# Patient Record
Sex: Female | Born: 1950 | Hispanic: No | Smoking: Current every day smoker
Health system: Southern US, Community
[De-identification: ages and names within clinical notes are randomized; demographics above are authoritative.]

## PROBLEM LIST (undated history)

## (undated) DIAGNOSIS — I729 Aneurysm of unspecified site: Secondary | ICD-10-CM

## (undated) DIAGNOSIS — R4701 Aphasia: Secondary | ICD-10-CM

## (undated) DIAGNOSIS — I219 Acute myocardial infarction, unspecified: Secondary | ICD-10-CM

## (undated) DIAGNOSIS — I639 Cerebral infarction, unspecified: Secondary | ICD-10-CM

## (undated) HISTORY — PX: ABDOMINAL HYSTERECTOMY: SHX81

## (undated) HISTORY — PX: ANEURYSM COILING: SHX5349

---

## 2002-02-28 ENCOUNTER — Ambulatory Visit (HOSPITAL_BASED_OUTPATIENT_CLINIC_OR_DEPARTMENT_OTHER): Admission: RE | Admit: 2002-02-28 | Discharge: 2002-02-28 | Payer: Self-pay | Admitting: *Deleted

## 2002-04-08 ENCOUNTER — Ambulatory Visit (HOSPITAL_BASED_OUTPATIENT_CLINIC_OR_DEPARTMENT_OTHER): Admission: RE | Admit: 2002-04-08 | Discharge: 2002-04-08 | Payer: Self-pay | Admitting: *Deleted

## 2008-12-16 ENCOUNTER — Emergency Department (HOSPITAL_BASED_OUTPATIENT_CLINIC_OR_DEPARTMENT_OTHER): Admission: EM | Admit: 2008-12-16 | Discharge: 2008-12-16 | Payer: Self-pay | Admitting: Emergency Medicine

## 2010-07-12 LAB — COMPREHENSIVE METABOLIC PANEL
ALT: 12 U/L (ref 0–35)
AST: 27 U/L (ref 0–37)
Albumin: 4.3 g/dL (ref 3.5–5.2)
CO2: 30 mEq/L (ref 19–32)
Calcium: 9.6 mg/dL (ref 8.4–10.5)
GFR calc Af Amer: >60 mL/min (ref >60)
Sodium: 144 mEq/L (ref 135–145)
Total Protein: 7.6 g/dL (ref 6.0–8.3)

## 2010-07-12 LAB — CBC
MCHC: 35 g/dL (ref 30.0–36.0)
RBC: 4.72 MIL/uL (ref 3.87–5.11)
RDW: 13.4 % (ref 11.5–15.5)

## 2010-07-12 LAB — POCT I-STAT 3, ART BLOOD GAS (G3+)
Acid-Base Excess: 3 mmol/L — ABNORMAL HIGH (ref 0.0–2.0)
Bicarbonate: 27.3 meq/L — ABNORMAL HIGH (ref 20.0–24.0)
O2 Saturation: 95 %
Patient temperature: 37
TCO2: 29 mmol/L (ref 0–100)
pCO2 arterial: 41.8 mmHg (ref 35.0–45.0)
pH, Arterial: 7.423 — ABNORMAL HIGH (ref 7.350–7.400)
pO2, Arterial: 73 mmHg — ABNORMAL LOW (ref 80.0–100.0)

## 2010-07-12 LAB — DIFFERENTIAL
Basophils Absolute: 0.1 K/uL (ref 0.0–0.1)
Basophils Relative: 1 % (ref 0–1)
Eosinophils Absolute: 0.2 K/uL (ref 0.0–0.7)
Eosinophils Relative: 2 % (ref 0–5)
Lymphocytes Relative: 21 % (ref 12–46)
Lymphs Abs: 1.7 K/uL (ref 0.7–4.0)
Monocytes Absolute: 0.6 K/uL (ref 0.1–1.0)
Monocytes Relative: 8 % (ref 3–12)
Neutro Abs: 5.3 K/uL (ref 1.7–7.7)
Neutrophils Relative %: 68 % (ref 43–77)

## 2010-07-12 LAB — POCT TOXICOLOGY PANEL: Benzodiazepines: POSITIVE

## 2010-07-12 LAB — GLUCOSE, CAPILLARY: Glucose-Capillary: 85 mg/dL (ref 70–99)

## 2010-07-12 LAB — URINALYSIS, ROUTINE W REFLEX MICROSCOPIC
Glucose, UA: NEGATIVE mg/dL
Leukocytes, UA: NEGATIVE
Nitrite: NEGATIVE
Protein, ur: NEGATIVE mg/dL
pH: 7 (ref 5.0–8.0)

## 2010-07-12 LAB — URINE MICROSCOPIC-ADD ON

## 2010-07-12 LAB — SALICYLATE LEVEL: Salicylate Lvl: 1 mg/dL — ABNORMAL LOW (ref 2.8–20.0)

## 2010-07-12 LAB — ETHANOL

## 2010-07-12 LAB — ACETAMINOPHEN LEVEL

## 2010-08-23 NOTE — Op Note (Signed)
   NAME:  [name], Melinda Hinton                         ACCOUNT NO.:  [id number]   MEDICAL RECORD NO.:  [id number]                   PATIENT TYPE:  AMB   LOCATION:  DSC                                  FACILITY:  [facility]   PHYSICIAN:  Kathy Breach, M.D.                   DATE OF BIRTH:  1950/12/07   DATE OF PROCEDURE:  04/08/2002  DATE OF DISCHARGE:                                 OPERATIVE REPORT   PREOPERATIVE DIAGNOSIS:  Retracting tympanic membrane AS due to eustachian  tube dysfunction six weeks post fascial graft tympanoplasty.   OPERATION/PROCEDURE:  Myringotomy, insertion of tube AS.   POSTOPERATIVE DIAGNOSIS:  Retracting tympanic membrane AS due to eustachian  tube dysfunction six weeks post fascial graft tympanoplasty.   DESCRIPTION OF PROCEDURE:  With the patient under general inhalation  anesthesia, the left ear was inspected with the operating microscope.  The  patient had an intact thick tympanic membrane with retracting anterior  superiorly and an anterior quadrant.  This is a recent fascial graft  tympanoplasty of the large anterior perforation which had been a  tympanoplasty failure elsewhere previously.  Attempted myringotomy in the  office was unsuccessful, unable to get an incision completely through the  thickened tympanic membrane.  A radial myringotomy incision was made at the  junction of the anterior superior, the anterior inferior quadrants.  The  tympanic membrane was very thick with the fascia from the fascial grafts  still in place but with persistence and some difficulty, eventually an  incision was gotten completely through the tympanic membrane into the middle  ear space.  This was documented by readily passing air retrograde through  the eustachian tube.  Because of the thickness and flaccidity of the  tympanic membrane, I was unable to successfully insert a T tube but a #1  __________tube was inserted through-and-through with retrograde patency of  eustachian tube demonstrated with retrograde pneumatic pressure.  Floxin  Otic drops were displaced.  The patient tolerated the procedure well, was  taken to the recovery room in stable general condition.                                                 Kathy Breach, M.D.    [name]  D:  04/08/2002  T:  04/08/2002  Job:  [id number]

## 2010-08-23 NOTE — Op Note (Signed)
NAME:  Melinda Hinton, Melinda Hinton NO.:  [id number]   MEDICAL RECORD NO.:  [id number]                   PATIENT TYPE:  AMB   LOCATION:  DSC                                  FACILITY:  [facility]   PHYSICIAN:  Kathy Breach, M.D.                   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   OPERATION PERFORMED:  Bipedicle fascia graft, type 1 tympanoplasty, left  ear.   SURGEON:  Kathy Breach, M.D.   ANESTHESIA:  General orotracheal anesthesia.   DESCRIPTION OF PROCEDURE:  With the patient under general orotracheal  anesthesia, the left ear was prepped and draped in sterile fashion.  Inspection of the ear revealed  a 5 mm perforation involving centrally the  anterior inferior quadrant extending somewhat into the posterior inferior  and anterior superior quadrants.  The posterior margin of the perforation  was adherent down onto the promontory.  Through it the anterior rim could be  directly visualized.  Canal skin was infiltrated with 1% Xylocaine with  1:100,000 epinephrine for vasoconstriction.  Similar injection posterior  superior to the auricle above the hair line for donor site incision line.  The margins of the perforation were trimmed 360 degrees and freshened.  A  sickle knife was used to break down the adhesions of the posterior rim onto  the promontory.   About a 1-1/2 inch slightly curved incision posterior superior to the  auricle above the hairline was made immediately inferior to the previous  donor graft site incision.  The incision was carried down  through the skin  and into the subcutaneous tissue encountering a heavy layer of scar tissue.  This was divided, encountering anterior superior to the previous donor site  temporalis fascia.  A large temporalis fascia graft was harvested.  Hemostasis complete with touch electrocautery.  The incision was closed with  interrupted 4-0 chromic catgut subcutaneous sutures and the skin  approximated with a running 4-0 chromic catgut suture.   Attention returned to the ear.  A tympanotomy flap was incised from 6  o'clock inferiorly and 12 o'clock superiorly.  A flap was elevated entering  the middle ear space posterior superiorly identifying the lateral and  inferior surface of the chorda tympany in its normal anatomic position.  Immediately anterior to this, there were heavy adhesions posterior superior  quadrant which were cleaned away and broken down.  Complete adhesions along  the handle of the malleus to the long process of the incus and as well as  from the stapedius tendon to the promontory.  As these were cleared away,  manipulation of the long handle of the malleus revealed mobility of the  incus and an intact ossicular chain.  Further adhesions to the promontory  and the medial wall of the middle ear space to the undersurface of the  tympanic membrane anterosuperiorly were cleaned away.  The flap was turned  completely forward.  Gelfoam soaked in Floxin Otic solution was used to fill  the anterior half of the mesotympanum.  The previously harvested and pressed  and dried fascia graft was then inserted on the bed of Gelfoam beneath the  anterior tympanic membrane remnant and positioned spanning the perforation  site tucking it medial to the anterior rim perforation.  The middle ear  space was then filled completely with Gelfoam pledgets soaked with Floxin  Otic solution supporting the fascia graft up against the undersurface  tympanic membrane remnant and canal skin flap.  Careful  inspection showed  the fascia graft filled well into the perforation site completely spanning  the perforation site with the tympanotomy flap turned back in position.  It  was further stabilized then packing the external canal with Gelfoam soaked  with Floxin Otic suspension.  Sterile cotton was placed in the external  meatus.  The patient tolerated the procedure well and was taken to the  recovery room in stable general condition.                                                Kathy Breach, M.D.    [name]  D:  02/28/2002  T:  02/28/2002  Job:  [id number]

## 2012-04-24 ENCOUNTER — Encounter (HOSPITAL_BASED_OUTPATIENT_CLINIC_OR_DEPARTMENT_OTHER): Payer: Self-pay | Admitting: *Deleted

## 2012-04-24 ENCOUNTER — Emergency Department (HOSPITAL_BASED_OUTPATIENT_CLINIC_OR_DEPARTMENT_OTHER)
Admission: EM | Admit: 2012-04-24 | Discharge: 2012-04-25 | Disposition: A | Discharge status: Home / Self Care | Payer: Medicare Other | Attending: Emergency Medicine | Admitting: Emergency Medicine

## 2012-04-24 DIAGNOSIS — F17200 Nicotine dependence, unspecified, uncomplicated: Secondary | ICD-10-CM | POA: Insufficient documentation

## 2012-04-24 DIAGNOSIS — Z8679 Personal history of other diseases of the circulatory system: Secondary | ICD-10-CM | POA: Insufficient documentation

## 2012-04-24 DIAGNOSIS — H9209 Otalgia, unspecified ear: Secondary | ICD-10-CM | POA: Insufficient documentation

## 2012-04-24 DIAGNOSIS — H9202 Otalgia, left ear: Secondary | ICD-10-CM

## 2012-04-24 DIAGNOSIS — Z8673 Personal history of transient ischemic attack (TIA), and cerebral infarction without residual deficits: Secondary | ICD-10-CM | POA: Insufficient documentation

## 2012-04-24 HISTORY — DX: Cerebral infarction, unspecified: I63.9

## 2012-04-24 HISTORY — DX: Aphasia: R47.01

## 2012-04-24 HISTORY — DX: Aneurysm of unspecified site: I72.9

## 2012-04-24 MED ORDER — IBUPROFEN 400 MG PO TABS
600.0000 mg | ORAL_TABLET | Freq: Once | ORAL | Status: AC
  Administered 2012-04-24: 600 mg via ORAL
  Filled 2012-04-24: qty 1

## 2012-04-24 MED ORDER — ANTIPYRINE-BENZOCAINE 5.4-1.4 % OT SOLN
3.0000 [drp] | Freq: Once | OTIC | Status: AC
  Administered 2012-04-24: 3 [drp] via OTIC
  Filled 2012-04-24: qty 10

## 2012-04-24 MED ORDER — CIPROFLOXACIN-HYDROCORTISONE 0.2-1 % OT SUSP: 3.0000 [drp] | Freq: Two times a day (BID) | OTIC | Status: DC

## 2012-04-24 NOTE — ED Provider Notes (Signed)
History   Scribed for No att. providers found, the patient was seen in room MHOTF/OTF . This chart was scribed by Lewanda Rife.   CSN: [id number]  Arrival date & time 04/24/12  2242   First MD Initiated Contact with Patient 04/24/12 2321      Chief Complaint  Patient presents with  . Otalgia    (Consider location/radiation/quality/duration/timing/severity/associated sxs/prior Treatment) Hx provided by husband. Level 5 caveat applies due to expressive aphasia.  Patient is a 62 y.o. female presenting with ear pain. The history is provided by the spouse and the patient. History Limited By: expressive aphasia. No language interpreter was used.  Otalgia This is a new problem. The current episode started more than 2 days ago. There is pain in the left ear. The problem occurs constantly. The problem has been gradually worsening. There has been no fever. The pain is severe. Associated symptoms include ear discharge. Pertinent negatives include no headaches, no hearing loss, no diarrhea and no vomiting.   Melinda Hinton is a 62 y.o. female who presents to the Emergency Department complaining of worsening constant left earache onset this evening. Husband reports pt had ears cleaned 4 days ago at the ENT office. Husband denies fever. Pt describes the pain as severe. Husband reports pt had some sanguineous drainage following procedure, but reports drainage is clear today. Husband reports giving pt 2 Tylenol PM's at home to treat pain with mild relief.   Past Medical History  Diagnosis Date  . Stroke   . Aneurysm   . Expressive aphasia     Past Surgical History  Procedure Date  . Aneurysm coiling     No family history on file.  History  Substance Use Topics  . Smoking status: Current Every Day Smoker  . Smokeless tobacco: Not on file  . Alcohol Use: No    OB History    Grav Para Term Preterm Abortions TAB SAB Ect Mult Living                  Review of Systems  Unable to  perform ROS: Other  Constitutional: Negative.   HENT: Positive for ear pain (left ear) and ear discharge. Negative for hearing loss.   Respiratory: Negative.   Cardiovascular: Negative.   Gastrointestinal: Negative.  Negative for vomiting and diarrhea.  Musculoskeletal: Negative.   Skin: Negative.   Neurological: Negative.  Negative for headaches.  Hematological: Negative.   Psychiatric/Behavioral: Negative.   level 5 caveat   Allergies  Review of patient's allergies indicates no known allergies.  Home Medications   Current Outpatient Rx  Name  Route  Sig  Dispense  Refill  . CIPROFLOXACIN-HYDROCORTISONE 0.2-1 % OT SUSP   Left Ear   Place 3 drops into the left ear 2 (two) times daily.   10 mL   0     BP 220/80  Pulse 80  Temp 97.5 F (36.4 C)  Resp 18  Ht 5\' 1"  (1.549 m)  Wt 130 lb (58.968 kg)  BMI 24.56 kg/m2  SpO2 98%  LMP 04/24/2012  Physical Exam  Nursing note and vitals reviewed. Constitutional: She appears well-developed and well-nourished.  HENT:  Head: Normocephalic and atraumatic.  Right Ear: Tympanic membrane normal.       Left TM is thickened No TM rupture, but external EAC is erythematous  No pain with movement of the pinna    Eyes: Conjunctivae normal are normal.  Neck: Normal range of motion. Neck supple.  Cardiovascular: Normal rate.  Pulmonary/Chest: Effort normal.  Lymphadenopathy:    She has no cervical adenopathy.  Neurological: She is alert.  Skin: Skin is warm and dry.  Psychiatric: She has a normal mood and affect.  note- no ttp over mastoid process  ED Course  Procedures (including critical care time)  Labs Reviewed - No data to display No results found.   1. Left ear pain       MDM  Pt presenting with left ear pain-had ear irrigated and vacuumed for cerumen impaction 3 days ago.  On exam EAC is erythematous, no OM- may be early otitis externa.  Pt given auralgan in ED and rx for cipro otic drops.  If pain persists, they  will contact ENT on Monday.  Discharged with strict return precautions.  Pt agreeable with plan.    I personally performed the services described in this documentation, which was scribed in my presence. The recorded information has been reviewed and is accurate.    Ethelda Chick, MD 04/25/12 [phone]

## 2012-04-24 NOTE — ED Notes (Signed)
Pt's husband states pt has a hx of CVA with expressive aphasia.  Husband states pt had her ears cleaned at the ENT on Wednesday. Pt. Was crying this evening c/o left ear hurting. No fevers per husband. Denies any cold/congestion. No  Other complaints.

## 2012-04-25 ENCOUNTER — Encounter (HOSPITAL_BASED_OUTPATIENT_CLINIC_OR_DEPARTMENT_OTHER): Payer: Self-pay | Admitting: *Deleted

## 2012-04-25 ENCOUNTER — Emergency Department (HOSPITAL_BASED_OUTPATIENT_CLINIC_OR_DEPARTMENT_OTHER)
Admission: EM | Admit: 2012-04-25 | Discharge: 2012-04-25 | Disposition: A | Discharge status: Home / Self Care | Payer: Medicare Other | Attending: Emergency Medicine | Admitting: Emergency Medicine

## 2012-04-25 DIAGNOSIS — F17200 Nicotine dependence, unspecified, uncomplicated: Secondary | ICD-10-CM | POA: Insufficient documentation

## 2012-04-25 DIAGNOSIS — H1045 Other chronic allergic conjunctivitis: Secondary | ICD-10-CM | POA: Insufficient documentation

## 2012-04-25 DIAGNOSIS — H6690 Otitis media, unspecified, unspecified ear: Secondary | ICD-10-CM

## 2012-04-25 DIAGNOSIS — H5789 Other specified disorders of eye and adnexa: Secondary | ICD-10-CM | POA: Insufficient documentation

## 2012-04-25 DIAGNOSIS — H9210 Otorrhea, unspecified ear: Secondary | ICD-10-CM | POA: Insufficient documentation

## 2012-04-25 DIAGNOSIS — Z8673 Personal history of transient ischemic attack (TIA), and cerebral infarction without residual deficits: Secondary | ICD-10-CM | POA: Insufficient documentation

## 2012-04-25 DIAGNOSIS — H1010 Acute atopic conjunctivitis, unspecified eye: Secondary | ICD-10-CM

## 2012-04-25 DIAGNOSIS — H9190 Unspecified hearing loss, unspecified ear: Secondary | ICD-10-CM | POA: Insufficient documentation

## 2012-04-25 DIAGNOSIS — Z8659 Personal history of other mental and behavioral disorders: Secondary | ICD-10-CM | POA: Insufficient documentation

## 2012-04-25 DIAGNOSIS — Z8679 Personal history of other diseases of the circulatory system: Secondary | ICD-10-CM | POA: Insufficient documentation

## 2012-04-25 MED ORDER — DIPHENHYDRAMINE HCL 25 MG PO TABS: 25.0000 mg | ORAL_TABLET | Freq: Three times a day (TID) | ORAL | Status: DC | PRN

## 2012-04-25 MED ORDER — AMOXICILLIN-POT CLAVULANATE 875-125 MG PO TABS
1.0000 | ORAL_TABLET | Freq: Once | ORAL | Status: AC
  Administered 2012-04-25: 1 via ORAL
  Filled 2012-04-25: qty 1

## 2012-04-25 MED ORDER — CETIRIZINE HCL 10 MG PO CAPS: 10.0000 mg | ORAL_CAPSULE | Freq: Every day | ORAL | Status: DC

## 2012-04-25 MED ORDER — AMOXICILLIN-POT CLAVULANATE 875-125 MG PO TABS: 1.0000 | ORAL_TABLET | Freq: Two times a day (BID) | ORAL | Status: DC

## 2012-04-25 NOTE — ED Provider Notes (Signed)
History     CSN: [id number]  Arrival date & time 04/25/12  1309   First MD Initiated Contact with Patient 04/25/12 1358      Chief Complaint  Patient presents with  . Facial Swelling    (Consider location/radiation/quality/duration/timing/severity/associated sxs/prior treatment) HPI Comments: Patient who was seen in the ED yesterday dx with acute otitis externa presents with left ear and eye swelling.  Patient was given Antipyrine drops which she used last night.  She was prescribed Cipro drops but has not had the prescription filled yet.  She woke up this morning with facial swelling and redness around her left eye with associated discharge.  She denies eye pain or changes in vision.  Her husband states that her left ear also looks more swollen and red.  He reports a clear discharge from her ear.  This discharge was purulent several days ago. Decreased hearing L ear. Patient denies fever, headache, nausea and diarrhea.  Initial onset gradual with acute worsening over past 12 hours, nothing has helped with symptoms.   The history is provided by the patient and the spouse.    Past Medical History  Diagnosis Date  . Stroke   . Aneurysm   . Expressive aphasia     Past Surgical History  Procedure Date  . Aneurysm coiling     History reviewed. No pertinent family history.  History  Substance Use Topics  . Smoking status: Current Every Day Smoker  . Smokeless tobacco: Not on file  . Alcohol Use: No    OB History    Grav Para Term Preterm Abortions TAB SAB Ect Mult Living                  Review of Systems  Constitutional: Negative for fever, chills, appetite change and fatigue.  HENT: Positive for hearing loss, ear pain, facial swelling and ear discharge. Negative for congestion, sore throat, rhinorrhea, trouble swallowing, neck pain, neck stiffness, sinus pressure and tinnitus.   Eyes: Positive for pain, discharge and redness. Negative for itching and visual disturbance.    Respiratory: Negative for cough and shortness of breath.   Cardiovascular: Negative for chest pain.  Gastrointestinal: Negative for nausea, vomiting, abdominal pain and diarrhea.  Genitourinary: Negative for dysuria.  Musculoskeletal: Negative for myalgias.  Skin: Negative for rash.  Neurological: Negative for headaches.    Allergies  Review of patient's allergies indicates no known allergies.  Home Medications   Current Outpatient Rx  Name  Route  Sig  Dispense  Refill  . CIPROFLOXACIN-HYDROCORTISONE 0.2-1 % OT SUSP   Left Ear   Place 3 drops into the left ear 2 (two) times daily.   10 mL   0     BP 188/82  Pulse 78  Temp 97.4 F (36.3 C) (Oral)  Resp 24  Ht 5\' 1"  (1.549 m)  Wt 130 lb (58.968 kg)  BMI 24.56 kg/m2  SpO2 95%  LMP 04/24/2012  Physical Exam  Nursing note and vitals reviewed. Constitutional: She appears well-developed and well-nourished.  HENT:  Head: Normocephalic and atraumatic.  Right Ear: Tympanic membrane and external ear normal. No mastoid tenderness. Tympanic membrane is not perforated and not bulging. No decreased hearing is noted.  Left Ear: There is drainage, swelling (of external ear, canal) and tenderness (external ear, canal). No foreign bodies. No mastoid tenderness. Tympanic membrane is perforated (possible). Tympanic membrane is not erythematous and not bulging. Decreased hearing is noted.  Nose: Nose normal.  Mouth/Throat: Oropharynx is clear  and moist.  Eyes: EOM are normal. Pupils are equal, round, and reactive to light. Right eye exhibits no chemosis and no discharge. Left eye exhibits chemosis. Left eye exhibits no discharge, no exudate and no hordeolum. No foreign body present in the left eye. Right conjunctiva is injected. Right conjunctiva has no hemorrhage. Left conjunctiva is injected. Left conjunctiva has no hemorrhage. No scleral icterus.         Mild erythema surrounding left eye without warmth.  Neck: Normal range of motion.  Neck supple.  Cardiovascular: Normal rate, regular rhythm and normal heart sounds.   Pulmonary/Chest: Effort normal and breath sounds normal.  Abdominal: Soft. There is no tenderness.  Lymphadenopathy:    She has no cervical adenopathy.  Neurological: She is alert.  Skin: Skin is warm and dry.  Psychiatric: She has a normal mood and affect.    ED Course  Procedures (including critical care time)  Labs Reviewed - No data to display No results found.   1. Otitis media   2. Allergic conjunctivitis     Patient seen and examined. Yesterday's chart reviewed.   Vital signs reviewed and are as follows: Filed Vitals:   04/25/12 1337  BP: 188/82  Pulse: 78  Temp: 97.4 F (36.3 C)  Resp: 24   Patient d/w and seen by Dr. Ranae Palms.   Suspect TM perforation. Will treat for allergic type conjunctivitis, start on augmentin for otitis media, d/c cipro drops and A/B otic given concern for TM rupture.   Patient has ENT followup and will followup early this week.   MDM  Ear pain swelling: Concern for ruptured TM on left. There is irritation of the external ear and ear canal without mastoid tenderness or signs of malignant otitis externa. Patient has close ENT followup. She appears well, nontoxic. Afebrile.  Conjunctivitis with chemosis: Redness around the eye and chemosis is more consistent with allergic type conjunctivitis than cellulitis. Do not suspect orbital or periorbital cellulitis. Patient has good range of motion of eyes without pain. Again, no fever. No history of diabetes. Will treat with antihistamines.       Renne Crigler, [place] 04/25/12 [phone]

## 2012-04-25 NOTE — ED Notes (Signed)
Here last night for same. Given Antipyrine drops to take home. Did not get antibiotic drops filled. Now presents with increased redness and swelling.

## 2012-04-26 NOTE — ED Provider Notes (Signed)
Medical screening examination/treatment/procedure(s) were conducted as a shared visit with non-physician practitioner(s) and myself.  I personally evaluated the patient during the encounter   Loren Racer, MD 04/26/12 [phone]

## 2013-01-11 ENCOUNTER — Emergency Department (HOSPITAL_BASED_OUTPATIENT_CLINIC_OR_DEPARTMENT_OTHER)
Admission: EM | Admit: 2013-01-11 | Discharge: 2013-01-11 | Disposition: A | Discharge status: Other Acute Inpatient Hospital | Payer: Medicare Other | Attending: Emergency Medicine | Admitting: Emergency Medicine

## 2013-01-11 ENCOUNTER — Encounter (HOSPITAL_BASED_OUTPATIENT_CLINIC_OR_DEPARTMENT_OTHER): Payer: Self-pay | Admitting: *Deleted

## 2013-01-11 ENCOUNTER — Emergency Department (HOSPITAL_BASED_OUTPATIENT_CLINIC_OR_DEPARTMENT_OTHER): Payer: Medicare Other

## 2013-01-11 DIAGNOSIS — Z792 Long term (current) use of antibiotics: Secondary | ICD-10-CM | POA: Insufficient documentation

## 2013-01-11 DIAGNOSIS — F17200 Nicotine dependence, unspecified, uncomplicated: Secondary | ICD-10-CM | POA: Insufficient documentation

## 2013-01-11 DIAGNOSIS — Y939 Activity, unspecified: Secondary | ICD-10-CM | POA: Insufficient documentation

## 2013-01-11 DIAGNOSIS — S0100XA Unspecified open wound of scalp, initial encounter: Secondary | ICD-10-CM | POA: Insufficient documentation

## 2013-01-11 DIAGNOSIS — R42 Dizziness and giddiness: Secondary | ICD-10-CM | POA: Insufficient documentation

## 2013-01-11 DIAGNOSIS — W19XXXA Unspecified fall, initial encounter: Secondary | ICD-10-CM | POA: Insufficient documentation

## 2013-01-11 DIAGNOSIS — I4581 Long QT syndrome: Secondary | ICD-10-CM | POA: Insufficient documentation

## 2013-01-11 DIAGNOSIS — R4701 Aphasia: Secondary | ICD-10-CM | POA: Insufficient documentation

## 2013-01-11 DIAGNOSIS — Y92009 Unspecified place in unspecified non-institutional (private) residence as the place of occurrence of the external cause: Secondary | ICD-10-CM | POA: Insufficient documentation

## 2013-01-11 DIAGNOSIS — S065X9A Traumatic subdural hemorrhage with loss of consciousness of unspecified duration, initial encounter: Secondary | ICD-10-CM

## 2013-01-11 DIAGNOSIS — R9431 Abnormal electrocardiogram [ECG] [EKG]: Secondary | ICD-10-CM

## 2013-01-11 DIAGNOSIS — Z79899 Other long term (current) drug therapy: Secondary | ICD-10-CM | POA: Insufficient documentation

## 2013-01-11 DIAGNOSIS — Z8673 Personal history of transient ischemic attack (TIA), and cerebral infarction without residual deficits: Secondary | ICD-10-CM | POA: Insufficient documentation

## 2013-01-11 DIAGNOSIS — R488 Other symbolic dysfunctions: Secondary | ICD-10-CM | POA: Insufficient documentation

## 2013-01-11 DIAGNOSIS — Z8679 Personal history of other diseases of the circulatory system: Secondary | ICD-10-CM | POA: Insufficient documentation

## 2013-01-11 DIAGNOSIS — IMO0002 Reserved for concepts with insufficient information to code with codable children: Secondary | ICD-10-CM | POA: Insufficient documentation

## 2013-01-11 DIAGNOSIS — Z23 Encounter for immunization: Secondary | ICD-10-CM | POA: Insufficient documentation

## 2013-01-11 LAB — COMPREHENSIVE METABOLIC PANEL
ALT: 27 U/L (ref 0–35)
Albumin: 4.6 g/dL (ref 3.5–5.2)
Alkaline Phosphatase: 115 U/L (ref 39–117)
Calcium: 10.7 mg/dL — ABNORMAL HIGH (ref 8.4–10.5)
GFR calc Af Amer: 88 mL/min — ABNORMAL LOW (ref >90)
Potassium: 3.2 mEq/L — ABNORMAL LOW (ref 3.5–5.1)
Sodium: 142 mEq/L (ref 135–145)
Total Protein: 7.8 g/dL (ref 6.0–8.3)

## 2013-01-11 LAB — CBC WITH DIFFERENTIAL/PLATELET
Basophils Absolute: 0 10*3/uL (ref 0.0–0.1)
Basophils Relative: 0 % (ref 0–1)
Eosinophils Absolute: 0 10*3/uL (ref 0.0–0.7)
Eosinophils Relative: 0 % (ref 0–5)
MCH: 31.4 pg (ref 26.0–34.0)
MCHC: 34.8 g/dL (ref 30.0–36.0)
MCV: 90.4 fL (ref 78.0–100.0)
Neutrophils Relative %: 88 % — ABNORMAL HIGH (ref 43–77)
Platelets: 339 10*3/uL (ref 150–400)
RBC: 4.77 MIL/uL (ref 3.87–5.11)
RDW: 13.2 % (ref 11.5–15.5)

## 2013-01-11 LAB — URINE MICROSCOPIC-ADD ON

## 2013-01-11 LAB — URINALYSIS, ROUTINE W REFLEX MICROSCOPIC
Bilirubin Urine: NEGATIVE
Hgb urine dipstick: NEGATIVE
Ketones, ur: 15 mg/dL — AB
Nitrite: NEGATIVE
Protein, ur: 100 mg/dL — AB
Specific Gravity, Urine: 1.021 (ref 1.005–1.030)
Urobilinogen, UA: 0.2 mg/dL (ref 0.0–1.0)

## 2013-01-11 LAB — PROTIME-INR
INR: 0.95 (ref 0.00–1.49)
Prothrombin Time: 12.5 seconds (ref 11.6–15.2)

## 2013-01-11 LAB — CK TOTAL AND CKMB (NOT AT ARMC)
Relative Index: INVALID (ref 0.0–2.5)
Total CK: 63 U/L (ref 7–177)

## 2013-01-11 LAB — TROPONIN I: Troponin I: <0.3 ng/mL (ref <0.30)

## 2013-01-11 MED ORDER — TETANUS-DIPHTH-ACELL PERTUSSIS 5-2.5-18.5 LF-MCG/0.5 IM SUSP
0.5000 mL | Freq: Once | INTRAMUSCULAR | Status: AC
  Administered 2013-01-11: 0.5 mL via INTRAMUSCULAR
  Filled 2013-01-11: qty 0.5

## 2013-01-11 MED ORDER — ACETAMINOPHEN 500 MG PO TABS
1000.0000 mg | ORAL_TABLET | Freq: Once | ORAL | Status: AC
  Administered 2013-01-11: 1000 mg via ORAL
  Filled 2013-01-11: qty 2

## 2013-01-11 NOTE — ED Notes (Signed)
Husband came home found wife on concrete floor w lac to back of head,  Does not know what caused her to fall,  Unsure of loc

## 2013-01-11 NOTE — ED Notes (Signed)
MD at bedside., pt awaiting transport to [facility], husband remains at bedside

## 2013-01-11 NOTE — ED Provider Notes (Signed)
CSN: [id number]     Arrival date & time 01/11/13  1739 History   First MD Initiated Contact with Patient 01/11/13 1754     Chief Complaint  Patient presents with  . Fall   (Consider location/radiation/quality/duration/timing/severity/associated sxs/prior Treatment) The history is provided by the spouse, a relative and medical records. No language interpreter was used.    62 year old female presents the emergency department brought in by family.  The patient has a past medical history of stroke and aneurysm.  She has both expressive aphasia and apraxia.  States that he went out to do some pressure washing for approximately 5 hours.  When he returned he found her lying in a pool of blood on the concrete.  He is unknown how long she was on the floor.  The patient is able to understand speech and cannot yes and no to questions.  She does acknowledge some dizziness beforehand and it did not appear that she had a mechanical fall today.  She also acknowledges having a slight headache at this time.  The patient has a small laceration to the back of the scalp.  She is not taking any blood thinners   Past Medical History  Diagnosis Date  . Stroke   . Aneurysm   . Expressive aphasia    Past Surgical History  Procedure Laterality Date  . Aneurysm coiling     History reviewed. No pertinent family history. History  Substance Use Topics  . Smoking status: Current Every Day Smoker  . Smokeless tobacco: Not on file  . Alcohol Use: No   OB History   Grav Para Term Preterm Abortions TAB SAB Ect Mult Living                 Review of Systems  Unable to perform ROS    Allergies  Review of patient's allergies indicates no known allergies.  Home Medications   Current Outpatient Rx  Name  Route  Sig  Dispense  Refill  . amoxicillin-clavulanate (AUGMENTIN) 875-125 MG per tablet   Oral   Take 1 tablet by mouth every 12 (twelve) hours.   14 tablet   0   . Cetirizine HCl 10 MG CAPS   Oral    Take 1 capsule (10 mg total) by mouth daily.   7 capsule   0   . ciprofloxacin-hydrocortisone (CIPRO HC) otic suspension   Left Ear   Place 3 drops into the left ear 2 (two) times daily.   10 mL   0   . diphenhydrAMINE (BENADRYL) 25 MG tablet   Oral   Take 1 tablet (25 mg total) by mouth every 8 (eight) hours as needed for itching or allergies.   10 tablet   0    BP 171/90  Pulse 99  Resp 16  Ht 5\' 2"  (1.575 m)  Wt 125 lb (56.7 kg)  BMI 22.86 kg/m2  SpO2 99%  LMP 04/24/2012 Physical Exam  Constitutional: She is oriented to person, place, and time. She appears well-developed and well-nourished. No distress.  HENT:  Head: Normocephalic.  1 cm laceration post scalp  Eyes: Conjunctivae and EOM are normal. Pupils are equal, round, and reactive to light. No scleral icterus.  Neck: Normal range of motion. Neck supple.  Cardiovascular: Normal rate, regular rhythm and normal heart sounds.  Exam reveals no gallop and no friction rub.   No murmur heard. Pulmonary/Chest: Effort normal and breath sounds normal. No respiratory distress.  Abdominal: Soft. Bowel sounds are normal.  She exhibits no distension and no mass. There is no tenderness. There is no guarding.  Neurological: She is alert and oriented to person, place, and time.  Skin: Skin is warm and dry. She is not diaphoretic.    ED Course  Procedures (including critical care time) Labs Review Labs Reviewed  CBC WITH DIFFERENTIAL - Abnormal; Notable for the following:    WBC 16.7 (*)    Neutrophils Relative % 88 (*)    Neutro Abs 14.7 (*)    Lymphocytes Relative 7 (*)    All other components within normal limits  COMPREHENSIVE METABOLIC PANEL - Abnormal; Notable for the following:    Potassium 3.2 (*)    Glucose, Bld 126 (*)    Calcium 10.7 (*)    GFR calc non Af Amer 76 (*)    GFR calc Af Amer 88 (*)    All other components within normal limits  CK TOTAL AND CKMB  PROTIME-INR  APTT  URINALYSIS, ROUTINE W REFLEX  MICROSCOPIC   Imaging Review Dg Chest 2 View  01/11/2013   CLINICAL DATA:  Short of breath  EXAM: CHEST  2 VIEW  COMPARISON:  Concurrently obtained head CT  FINDINGS: Low lung volumes with linear atelectasis versus scarring in the lingula. Mild central bronchitic changes. Negative for pulmonary edema, focal airspace consolidation, pleural effusion or pneumothorax. Cardiac and mediastinal contours are within normal limits. Atherosclerotic calcifications noted in the transverse aorta. No acute osseous abnormality. The bones appear mildly osteopenic.  IMPRESSION: 1. No active cardiopulmonary disease. 2. Linear atelectasis versus scarring in the lingula. 3. Central bronchitic changes. 4. Aortic atherosclerosis.   Electronically Signed   By: Malachy Moan M.D.   On: 01/11/2013 19:28   Ct Head Wo Contrast  01/11/2013   CLINICAL DATA:  Shoulder and  EXAM: CT HEAD WITHOUT CONTRAST  TECHNIQUE: Contiguous axial images were obtained from the base of the skull through the vertex without intravenous contrast.  COMPARISON:  None.  FINDINGS: High attenuation material layering over the left frontal convexity consistent with acute subdural hematoma. The maximum width of the hematoma measures 7 mm. Minimal local mass effect. No midline shift. Sequelae of prior left MCA and PCA territory infarcts. Coil pack is noted in the region of the supra clinoid left internal carotid artery versus proximal MCA. Streak artifact slightly limits evaluation of the middle and posterior cranial fossa. Surgical changes of prior left frontal and temporal craniotomy. No definite acute infarction, mass or mass effect. No acute calvarial abnormality. Mild soft tissue swelling overlying the occipital scalp without calvarial hematoma. Globes and orbits are intact and unremarkable. Normal aeration of the mastoid air cells and visualized paranasal sinuses.  : IMPRESSION:  Acute left frontal subdural hematoma.  Sequelae of prior a left supra clinoid  ICA versus proximal MCA coil embolization, left frontal temporal craniotomy and large remote left MCA and PCA territory infarcts.  Critical Value/emergent results were called by telephone at the time of interpretation on 01/11/2013 at 7:42 PM to Dr.Sheldon, who verbally acknowledged these results.   Electronically Signed   By: Malachy Moan M.D.   On: 01/11/2013 19:43      Date: 01/11/2013  Rate: 88  Rhythm: normal sinus rhythm  QRS Axis: normal  Intervals: QT prolonged and (598 ms)  ST/T Wave abnormalities: normal  Conduction Disutrbances:none  Narrative Interpretation:   Old EKG Reviewed: changes noted QT is prolonged   MDM   1. Fall, initial encounter   2. Subdural hematoma  3. QT prolongation    Patient with fall. Unknown if mecjhanical or unwitnessed syncope. Patient with Prolonged QT  8:23 PM CT shows acute left frontal subdural Hematoma. She has leukocytosis. Urine is pending. The patient has a long history at [facility]. I have placved a consult to Neurosurgery. The patient appears reasonably stabilized for admission considering the current resources, flow, and capabilities available in the ED at this time, and I doubt any other [facility] requiring further screening and/or treatment in the ED prior to admission.   DR. Freund ofd neursurgery asks for medical admission but will consult on the patient. I have spoken with Dr. Selena Batten who will admit.      Arthor Captain, PA-C 01/12/13 [phone]

## 2013-01-11 NOTE — ED Notes (Signed)
Husband reports finding pt on concrete patio after fall , down unknown amt of time, laceration to posterior head ? LOC

## 2013-01-12 NOTE — ED Provider Notes (Signed)
Medical screening examination/treatment/procedure(s) were conducted as a shared visit with non-physician practitioner(s) and myself.  I personally evaluated the patient during the encounter  Pt with history of stroke and aneurysmal bleed. Unwitnessed fall, has small SDH on CT. Admit for obs.   Chelbi Herber B. Bernette Mayers, MD 01/12/13 1404

## 2015-10-21 ENCOUNTER — Emergency Department (HOSPITAL_BASED_OUTPATIENT_CLINIC_OR_DEPARTMENT_OTHER): Payer: Medicare HMO

## 2015-10-21 ENCOUNTER — Emergency Department (HOSPITAL_BASED_OUTPATIENT_CLINIC_OR_DEPARTMENT_OTHER)
Admission: EM | Admit: 2015-10-21 | Discharge: 2015-10-22 | Disposition: A | Discharge status: Home / Self Care | Payer: Medicare HMO | Attending: Emergency Medicine | Admitting: Emergency Medicine

## 2015-10-21 ENCOUNTER — Encounter (HOSPITAL_BASED_OUTPATIENT_CLINIC_OR_DEPARTMENT_OTHER): Payer: Self-pay | Admitting: *Deleted

## 2015-10-21 DIAGNOSIS — R300 Dysuria: Secondary | ICD-10-CM | POA: Insufficient documentation

## 2015-10-21 DIAGNOSIS — R4182 Altered mental status, unspecified: Secondary | ICD-10-CM | POA: Insufficient documentation

## 2015-10-21 DIAGNOSIS — R509 Fever, unspecified: Secondary | ICD-10-CM | POA: Diagnosis not present

## 2015-10-21 DIAGNOSIS — Z7982 Long term (current) use of aspirin: Secondary | ICD-10-CM | POA: Diagnosis not present

## 2015-10-21 DIAGNOSIS — Z79899 Other long term (current) drug therapy: Secondary | ICD-10-CM | POA: Diagnosis not present

## 2015-10-21 DIAGNOSIS — Z8673 Personal history of transient ischemic attack (TIA), and cerebral infarction without residual deficits: Secondary | ICD-10-CM | POA: Insufficient documentation

## 2015-10-21 DIAGNOSIS — F17200 Nicotine dependence, unspecified, uncomplicated: Secondary | ICD-10-CM | POA: Insufficient documentation

## 2015-10-21 HISTORY — DX: Acute myocardial infarction, unspecified: I21.9

## 2015-10-21 LAB — CBC WITH DIFFERENTIAL/PLATELET
Basophils Absolute: 0 10*3/uL (ref 0.0–0.1)
Basophils Relative: 0 %
Eosinophils Absolute: 0 10*3/uL (ref 0.0–0.7)
Eosinophils Relative: 1 %
HCT: 41.9 % (ref 36.0–46.0)
Hemoglobin: 14.1 g/dL (ref 12.0–15.0)
Lymphocytes Relative: 5 %
Lymphs Abs: 0.4 10*3/uL — ABNORMAL LOW (ref 0.7–4.0)
MCH: 31.3 pg (ref 26.0–34.0)
MCHC: 33.7 g/dL (ref 30.0–36.0)
MCV: 93.1 fL (ref 78.0–100.0)
Monocytes Absolute: 0.7 10*3/uL (ref 0.1–1.0)
Monocytes Relative: 8 %
Neutro Abs: 7.5 10*3/uL (ref 1.7–7.7)
Neutrophils Relative %: 86 %
Platelets: 284 10*3/uL (ref 150–400)
RBC: 4.5 MIL/uL (ref 3.87–5.11)
RDW: 13.6 % (ref 11.5–15.5)
WBC: 8.6 10*3/uL (ref 4.0–10.5)

## 2015-10-21 LAB — COMPREHENSIVE METABOLIC PANEL
ALT: 18 U/L (ref 14–54)
AST: 21 U/L (ref 15–41)
Albumin: 4 g/dL (ref 3.5–5.0)
Alkaline Phosphatase: 81 U/L (ref 38–126)
Anion gap: 8 (ref 5–15)
BUN: 22 mg/dL — ABNORMAL HIGH (ref 6–20)
CO2: 23 mmol/L (ref 22–32)
Calcium: 9.1 mg/dL (ref 8.9–10.3)
Chloride: 107 mmol/L (ref 101–111)
Creatinine, Ser: 1 mg/dL (ref 0.44–1.00)
GFR calc Af Amer: >60 mL/min (ref >60)
GFR calc non Af Amer: 57 mL/min — ABNORMAL LOW (ref >60)
Glucose, Bld: 136 mg/dL — ABNORMAL HIGH (ref 65–99)
Potassium: 3.7 mmol/L (ref 3.5–5.1)
Sodium: 138 mmol/L (ref 135–145)
Total Bilirubin: 0.3 mg/dL (ref 0.3–1.2)
Total Protein: 7.3 g/dL (ref 6.5–8.1)

## 2015-10-21 LAB — URINE MICROSCOPIC-ADD ON: WBC, UA: NONE SEEN WBC/hpf (ref 0–5)

## 2015-10-21 LAB — URINALYSIS, ROUTINE W REFLEX MICROSCOPIC
Bilirubin Urine: NEGATIVE
Glucose, UA: NEGATIVE mg/dL
Ketones, ur: NEGATIVE mg/dL
Leukocytes, UA: NEGATIVE
Nitrite: NEGATIVE
Protein, ur: NEGATIVE mg/dL
Specific Gravity, Urine: 1.025 (ref 1.005–1.030)
pH: 6 (ref 5.0–8.0)

## 2015-10-21 MED ORDER — SODIUM CHLORIDE 0.9 % IV BOLUS (SEPSIS)
1000.0000 mL | Freq: Once | INTRAVENOUS | Status: AC
  Administered 2015-10-21: 1000 mL via INTRAVENOUS

## 2015-10-21 MED ORDER — ACETAMINOPHEN 325 MG PO TABS
650.0000 mg | ORAL_TABLET | Freq: Once | ORAL | Status: AC
  Administered 2015-10-21: 650 mg via ORAL
  Filled 2015-10-21: qty 2

## 2015-10-21 MED ORDER — DEXTROSE 5 % IV SOLN
1.0000 g | Freq: Once | INTRAVENOUS | Status: DC
  Filled 2015-10-21: qty 10

## 2015-10-21 MED ORDER — CEPHALEXIN 250 MG PO CAPS
500.0000 mg | ORAL_CAPSULE | Freq: Once | ORAL | Status: AC
  Administered 2015-10-22: 500 mg via ORAL
  Filled 2015-10-21: qty 2

## 2015-10-21 MED ORDER — CEPHALEXIN 500 MG PO CAPS: 500.0000 mg | ORAL_CAPSULE | Freq: Three times a day (TID) | ORAL | Status: DC

## 2015-10-21 NOTE — ED Notes (Addendum)
Pts daughter in law reports that pt was altered today,.  States that she was in bed all day, which is unusual for pt.  States that she had an LOC-she urinated on herself, pts son lowered her to the ground-denies pt hitting head.  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  States that she lives alone but has frequent caregivers in the home.  EMS was called to pts house-told pts family that she was dehydrated.

## 2015-10-21 NOTE — Discharge Instructions (Signed)
Fever, Adult A fever is an increase in the body's temperature. It is usually defined as a temperature of 100F (38C) or higher. Brief mild or moderate fevers generally have no long-term effects, and they often do not require treatment. Moderate or high fevers may make you feel uncomfortable and can sometimes be a sign of a serious illness or disease. The sweating that may occur with repeated or prolonged fever may also cause dehydration. Fever is confirmed by taking a temperature with a thermometer. A measured temperature can vary with:  Age.  Time of day.  Location of the thermometer:  Mouth (oral).  Rectum (rectal).  Ear (tympanic).  Underarm (axillary).  Forehead (temporal). HOME CARE INSTRUCTIONS Pay attention to any changes in your symptoms. Take these actions to help with your condition:  Take over-the counter and prescription medicines only as told by your health care provider. Follow the dosing instructions carefully.  If you were prescribed an antibiotic medicine, take it as told by your health care provider. Do not stop taking the antibiotic even if you start to feel better.  Rest as needed.  Drink enough fluid to keep your urine clear or pale yellow. This helps to prevent dehydration.  Sponge yourself or bathe with room-temperature water to help reduce your body temperature as needed. Do not use ice water.  Do not overbundle yourself in blankets or heavy clothes. SEEK MEDICAL CARE IF:  You vomit.  You cannot eat or drink without vomiting.  You have diarrhea.  You have pain when you urinate.  Your symptoms do not improve with treatment.  You develop new symptoms.  You develop excessive weakness. SEEK IMMEDIATE MEDICAL CARE IF:  You have shortness of breath or have trouble breathing.  You are dizzy or you faint.  You are disoriented or confused.  You develop signs of dehydration, such as a dry mouth, decreased urination, or paleness.  You develop  severe pain in your abdomen.  You have persistent vomiting or diarrhea.  You develop a skin rash.  Your symptoms suddenly get worse.   This information is not intended to replace advice given to you by your health care provider. Make sure you discuss any questions you have with your health care provider.   Document Released: 09/17/2000 Document Revised: 12/13/2014 Document Reviewed: 05/18/2014 Elsevier Interactive Patient Education 2016 [employer]  

## 2015-10-21 NOTE — ED Notes (Signed)
MD Kohut at bedside. 

## 2015-10-21 NOTE — ED Notes (Signed)
Pt and family made aware of need for urine specimen 

## 2015-10-21 NOTE — ED Notes (Signed)
CBG with EMS was 119

## 2015-10-21 NOTE — ED Provider Notes (Signed)
CSN: [id number]     Arrival date & time 10/21/15  2025 History  By signing my name below, I, Melinda Hinton, attest that this documentation has been prepared under the direction and in the presence of Raeford RazorStephen Markea Ruzich, MD . Electronically Signed: Levon HedgerElizabeth Hinton, Scribe. 10/21/2015. 9:53 PM.   Chief Complaint  Patient presents with  . Altered Mental Status    The history is provided by a relative and medical records. No language interpreter was used.   HPI Comments:  Melinda GowdaSandra Hinton is a 65 y.o. female with PMHx of aneurysm, stroke, expressive aphasia, and heart attack brought in by ambulance, who presents to the Emergency Department complaining of altered mental status onset today. Per daughter in law, pt had witnessed syncopal episode. Daughter in law states that she had been in bed all day, which is unlike pt. Family reports that pt reached out for her son, went limp, urinated on herself, and fell to the ground. Daughter in law estimates she was unconscious for about 30 seconds. Son reports she was normal yesterday. Pt told daughter in law this week she has dysuria and is currently being treated for a UTI. Pt reports associated abdominal pain. Family denies change in appetite, nausea, vomiting, rash. Pt is nonverbal due to old stroke.  Past Medical History  Diagnosis Date  . Stroke (HCC)   . Aneurysm (HCC)   . Expressive aphasia   . Heart attack [facility](HCC)    Past Surgical History  Procedure Laterality Date  . Aneurysm coiling     History reviewed. No pertinent family history. Social History  Substance Use Topics  . Smoking status: Current Every Day Smoker  . Smokeless tobacco: None  . Alcohol Use: No   OB History    No data available     Review of Systems  Gastrointestinal: Positive for abdominal pain.  Genitourinary: Positive for dysuria.  Neurological: Positive for syncope.  All other systems reviewed and are negative.   Allergies  Review of patient's allergies indicates no  known allergies.  Home Medications   Prior to Admission medications   Medication Sig Start Date End Date Taking? Authorizing Provider  aspirin 81 MG tablet Take 81 mg by mouth daily.   Yes Historical Provider, MD  carvedilol (COREG) 6.25 MG tablet Take 6.25 mg by mouth 2 (two) times daily with a meal.   Yes Historical Provider, MD  doxylamine, Sleep, (UNISOM) 25 MG tablet Take 25 mg by mouth at bedtime as needed.   Yes Historical Provider, MD  hydrOXYzine (VISTARIL) 25 MG capsule Take 25 mg by mouth 3 (three) times daily as needed.   Yes Historical Provider, MD  levothyroxine (SYNTHROID, LEVOTHROID) 50 MCG tablet Take 50 mcg by mouth daily before breakfast.   Yes Historical Provider, MD  nitrofurantoin, macrocrystal-monohydrate, (MACROBID) 100 MG capsule Take 100 mg by mouth 2 (two) times daily.   Yes Historical Provider, MD  topiramate (TOPAMAX) 50 MG tablet Take 50 mg by mouth 2 (two) times daily.   Yes Historical Provider, MD   BP 126/68 mmHg  Pulse 73  Temp(Src) 99.8 F (37.7 C) (Oral)  Resp 18  Ht 5\' 2"  (1.575 m)  Wt 121 lb (54.885 kg)  BMI 22.13 kg/m2  SpO2 99%  LMP 04/24/2012 Physical Exam  Constitutional: She is oriented to person, place, and time. She appears well-developed and well-nourished. No distress.  HENT:  Head: Normocephalic and atraumatic.  Eyes: Conjunctivae are normal.  Cardiovascular: Normal rate.   Pulmonary/Chest: Effort normal. No respiratory  distress. She has no wheezes. She has no rales. She exhibits no tenderness.  Abdominal: Soft. She exhibits no distension. There is no tenderness.  Neurological: She is alert and oriented to person, place, and time.  Nonverbal, will shake head to yes/no questions   Skin: Skin is warm and dry.  Psychiatric: She has a normal mood and affect.  Nursing note and vitals reviewed.   ED Course  Procedures  DIAGNOSTIC STUDIES:  Oxygen Saturation is 99% on RA, normal by my interpretation.    COORDINATION OF  CARE:  9:40 PM Will order CXR and urinalysis. Discussed treatment plan with pt at bedside and pt agreed to plan.  Labs Review Labs Reviewed  CBC WITH DIFFERENTIAL/PLATELET - Abnormal; Notable for the following:    Lymphs Abs 0.4 (*)    All other components within normal limits  COMPREHENSIVE METABOLIC PANEL - Abnormal; Notable for the following:    Glucose, Bld 136 (*)    BUN 22 (*)    GFR calc non Af Amer 57 (*)    All other components within normal limits  URINALYSIS, ROUTINE W REFLEX MICROSCOPIC (NOT AT [facility])    Imaging Review No results found. I have personally reviewed and evaluated these images and lab results as part of my medical decision-making.   EKG Interpretation None      EKG:  Rhythm: normal sinus Vent. rate 74 BPM QRS duration 91 ms QT/QTc 417/463 ms ST segments: NS ST changes across precordium     MDM   Final diagnoses:  Fever, unspecified fever cause  Altered mental status, unspecified altered mental status type    65yF with change in mental status. Febrile. Recently started on macrobid for UTI. Suspect UTI. UA pending. Would change abx if UA is still consistent with UTI. Maybe some mild dehydration with BUN:Cr of 22:1. Labs otherwise unremarkable. Given IVF. HD stable. I feel she is appropriate for outpt therapy. I personally preformed the services scribed in my presence. The recorded information has been reviewed is accurate. Raeford Razor, MD.   Raeford Razor, MD 11/08/15 [phone]

## 2015-10-21 NOTE — ED Notes (Signed)
Family reports that pt had syncopal episode at home today. 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 Pt has been in the bed all day today, which is unlike herself. Family reports that pt is usually up, ambulatory and doing things in the yard. The patient is currently on Macrobid for a UTI, awaiting cultures from this specimen that will be back on Monday.

## 2015-10-21 NOTE — ED Notes (Signed)
Pt on automatic VS, cardiac monitor and continuous pulse ox.

## 2015-10-22 NOTE — ED Notes (Signed)
Pt given d/c instructions as per chart. Rx x 1. Verbalizes understanding. No questions. 

## 2015-10-23 LAB — URINE CULTURE: Culture: NO GROWTH

## 2016-08-25 ENCOUNTER — Emergency Department (HOSPITAL_BASED_OUTPATIENT_CLINIC_OR_DEPARTMENT_OTHER): Payer: Medicare HMO

## 2016-08-25 ENCOUNTER — Emergency Department (HOSPITAL_BASED_OUTPATIENT_CLINIC_OR_DEPARTMENT_OTHER)
Admission: EM | Admit: 2016-08-25 | Discharge: 2016-08-25 | Disposition: A | Discharge status: Home / Self Care | Payer: Medicare HMO | Attending: Physician Assistant | Admitting: Physician Assistant

## 2016-08-25 ENCOUNTER — Encounter (HOSPITAL_BASED_OUTPATIENT_CLINIC_OR_DEPARTMENT_OTHER): Payer: Self-pay | Admitting: Emergency Medicine

## 2016-08-25 DIAGNOSIS — R2689 Other abnormalities of gait and mobility: Secondary | ICD-10-CM | POA: Diagnosis not present

## 2016-08-25 DIAGNOSIS — Z79899 Other long term (current) drug therapy: Secondary | ICD-10-CM | POA: Insufficient documentation

## 2016-08-25 DIAGNOSIS — Z7982 Long term (current) use of aspirin: Secondary | ICD-10-CM | POA: Insufficient documentation

## 2016-08-25 DIAGNOSIS — R51 Headache: Secondary | ICD-10-CM | POA: Diagnosis present

## 2016-08-25 DIAGNOSIS — F17200 Nicotine dependence, unspecified, uncomplicated: Secondary | ICD-10-CM | POA: Insufficient documentation

## 2016-08-25 DIAGNOSIS — H65112 Acute and subacute allergic otitis media (mucoid) (sanguinous) (serous), left ear: Secondary | ICD-10-CM

## 2016-08-25 DIAGNOSIS — H65192 Other acute nonsuppurative otitis media, left ear: Secondary | ICD-10-CM | POA: Insufficient documentation

## 2016-08-25 LAB — BASIC METABOLIC PANEL
ANION GAP: 9 (ref 5–15)
BUN: 20 mg/dL (ref 6–20)
CALCIUM: 9.2 mg/dL (ref 8.9–10.3)
CO2: 26 mmol/L (ref 22–32)
Chloride: 107 mmol/L (ref 101–111)
Creatinine, Ser: 0.93 mg/dL (ref 0.44–1.00)
GLUCOSE: 167 mg/dL — AB (ref 65–99)
Potassium: 3.2 mmol/L — ABNORMAL LOW (ref 3.5–5.1)
SODIUM: 142 mmol/L (ref 135–145)

## 2016-08-25 LAB — CBC
HEMATOCRIT: 42.7 % (ref 36.0–46.0)
HEMOGLOBIN: 14.3 g/dL (ref 12.0–15.0)
MCH: 30.6 pg (ref 26.0–34.0)
MCHC: 33.5 g/dL (ref 30.0–36.0)
MCV: 91.2 fL (ref 78.0–100.0)
Platelets: 312 10*3/uL (ref 150–400)
RBC: 4.68 MIL/uL (ref 3.87–5.11)
RDW: 14.3 % (ref 11.5–15.5)
WBC: 6.6 10*3/uL (ref 4.0–10.5)

## 2016-08-25 LAB — URINALYSIS, MICROSCOPIC (REFLEX): WBC UA: NONE SEEN WBC/hpf (ref 0–5)

## 2016-08-25 LAB — URINALYSIS, ROUTINE W REFLEX MICROSCOPIC
BILIRUBIN URINE: NEGATIVE
Glucose, UA: NEGATIVE mg/dL
Ketones, ur: NEGATIVE mg/dL
LEUKOCYTES UA: NEGATIVE
NITRITE: NEGATIVE
PH: 5.5 (ref 5.0–8.0)
Protein, ur: NEGATIVE mg/dL
SPECIFIC GRAVITY, URINE: 1.023 (ref 1.005–1.030)

## 2016-08-25 LAB — CBG MONITORING, ED: GLUCOSE-CAPILLARY: 136 mg/dL — AB (ref 65–99)

## 2016-08-25 MED ORDER — AMOXICILLIN 500 MG PO CAPS: 500.0000 mg | ORAL_CAPSULE | Freq: Three times a day (TID) | ORAL | 0 refills | Status: DC

## 2016-08-25 MED ORDER — AMOXICILLIN 500 MG PO CAPS
500.0000 mg | ORAL_CAPSULE | Freq: Once | ORAL | Status: AC
  Administered 2016-08-25: 500 mg via ORAL
  Filled 2016-08-25: qty 1

## 2016-08-25 NOTE — ED Provider Notes (Signed)
[facility] Provider Note   CSN: [id number] Arrival date & time: 08/25/16  1406  By signing my name below, I, Linna DarnerRussell Turner, attest that this documentation has been prepared under the direction and in the presence of physician practitioner, Corlis LeakMackuen, Cindee Saltourteney Lyn, MD. Electronically Signed: Linna Darnerussell Turner, Scribe. 08/25/2016. 3:51 PM.  History   Chief Complaint Chief Complaint  Patient presents with  . Headache   The history is provided by the patient and a relative. No language interpreter was used.    HPI Comments: Melinda Hinton is a 66 y.o. female with PMHx including stroke, seizures, expressive aphasia, aneurysm, and seasonal allergies who presents to the Emergency Department complaining of a persistent generalized headache for several days. Son reports associated right ear pain, nasal congestion, and some unsteady gait without any falls. Son reports that patient was started on medication for HTN two weeks ago and is concerned that this may be causing patient's symptoms. No alleviating factors noted. Patient denies nausea, vomiting, fevers, chills, or any other associated symptoms.  Past Medical History:  Diagnosis Date  . Aneurysm (HCC)   . Expressive aphasia   . Heart attack (HCC)   . Stroke [facility](HCC)     There are no active problems to display for this patient.   Past Surgical History:  Procedure Laterality Date  . ANEURYSM COILING      OB History    No data available       Home Medications    Prior to Admission medications   Medication Sig Start Date End Date Taking? Authorizing Provider  amitriptyline (ELAVIL) 25 MG tablet Take 25 mg by mouth at bedtime.   Yes [provider]  atorvastatin (LIPITOR) 80 MG tablet Take 80 mg by mouth daily.   Yes [provider]  lisinopril (PRINIVIL,ZESTRIL) 20 MG tablet Take 20 mg by mouth daily.   Yes [provider]  amoxicillin (AMOXIL) 500 MG capsule Take 1 capsule (500 mg total) by  mouth 3 (three) times daily. 08/25/16   Cottrell Gentles Lyn, MD  aspirin 81 MG tablet Take 81 mg by mouth daily.    [provider]  carvedilol (COREG) 6.25 MG tablet Take 6.25 mg by mouth 2 (two) times daily with a meal.    [provider]  cephALEXin (KEFLEX) 500 MG capsule Take 1 capsule (500 mg total) by mouth 3 (three) times daily. 10/21/15   Raeford RazorKohut, Stephen, MD  doxylamine, Sleep, (UNISOM) 25 MG tablet Take 25 mg by mouth at bedtime as needed.    [provider]  hydrOXYzine (VISTARIL) 25 MG capsule Take 25 mg by mouth 3 (three) times daily as needed.    [provider]  levothyroxine (SYNTHROID, LEVOTHROID) 50 MCG tablet Take 50 mcg by mouth daily before breakfast.    [provider]  nitrofurantoin, macrocrystal-monohydrate, (MACROBID) 100 MG capsule Take 100 mg by mouth 2 (two) times daily.    [provider]  topiramate (TOPAMAX) 50 MG tablet Take 50 mg by mouth 2 (two) times daily.    [provider]    Family History History reviewed. No pertinent family history.  Social History Social History  Substance Use Topics  . Smoking status: Current Every Day Smoker  . Smokeless tobacco: Never Used  . Alcohol use No     Allergies   Patient has no known allergies.   Review of Systems Review of Systems  Constitutional: Negative for chills and fever.  HENT: Positive for congestion and ear pain (R).  Gastrointestinal: Negative for nausea and vomiting.  Musculoskeletal: Positive for gait problem.  Neurological: Positive for speech difficulty (baseline without acute change) and headaches.   Physical Exam Updated Vital Signs BP (!) 157/72 (BP Location: Right Arm)   Pulse 68   Temp 98.5 F (36.9 C) (Oral)   Resp (!) 23   Wt 54.9 kg (121 lb)   LMP 04/24/2012   SpO2 100%   BMI 22.13 kg/m   Physical Exam  Constitutional: She is oriented to person, place, and time. She appears well-developed and well-nourished.  No distress.  HENT:  Head: Normocephalic and atraumatic.  Left ear opacified. Right ear with fluid present.  Eyes: Conjunctivae and EOM are normal.  Neck: Neck supple. No tracheal deviation present.  Cardiovascular: Normal rate.   Pulmonary/Chest: Effort normal. No respiratory distress.  Musculoskeletal: Normal range of motion.  Neurological: She is alert and oriented to person, place, and time.  CN 2-12 are intact. Good finger-to-nose. Appears baseline. Able to speak but prefers non-verbal communication.  Skin: Skin is warm and dry.  Psychiatric: She has a normal mood and affect. Her behavior is normal.  Nursing note and vitals reviewed.  ED Treatments / Results  Labs (all labs ordered are listed, but only abnormal results are displayed) Labs Reviewed  BASIC METABOLIC PANEL - Abnormal; Notable for the following:       Result Value   Potassium 3.2 (*)    Glucose, Bld 167 (*)    All other components within normal limits  URINALYSIS, ROUTINE W REFLEX MICROSCOPIC - Abnormal; Notable for the following:    Hgb urine dipstick TRACE (*)    All other components within normal limits  URINALYSIS, MICROSCOPIC (REFLEX) - Abnormal; Notable for the following:    Bacteria, UA RARE (*)    Squamous Epithelial / LPF 0-5 (*)    All other components within normal limits  CBG MONITORING, ED - Abnormal; Notable for the following:    Glucose-Capillary 136 (*)    All other components within normal limits  CBC    EKG  EKG Interpretation  Date/Time:  Monday Aug 25 2016 14:41:46 EDT Ventricular Rate:  70 PR Interval:    QRS Duration: 95 QT Interval:  440 QTC Calculation: 475 R Axis:   4 Text Interpretation:  Sinus rhythm Nonspecific T abnrm, anterolateral leads No significant change since last tracing Confirmed by KNAPP  MD-J, JON ([id number]) on 08/25/2016 2:47:40 PM       Radiology Ct Head Wo Contrast  Result Date: 08/25/2016 CLINICAL DATA:  Diffuse headache for several days.  Near pain.  EXAM: CT HEAD WITHOUT CONTRAST TECHNIQUE: Contiguous axial images were obtained from the base of the skull through the vertex without intravenous contrast. COMPARISON:  Head CT scan 07/27/2015.  Brain MRI 06/02/2015. FINDINGS: Brain: Extensive encephalomalacia in the left cerebral hemisphere is unchanged. No evidence of acute infarction, hemorrhage, midline shift or abnormal extra-axial fluid collection. No abnormality is seen at the site of the patient's known right periatrial AVM. Vascular: Status post coiling of a left ICA aneurysm. No acute abnormality. Skull: Left craniotomy defect is again seen.  No acute abnormality. Sinuses/Orbits: Clear. Other: None. IMPRESSION: No acute abnormality. 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 No abnormality is seen at the mid site of the patient's known right periatrial AVM. Electronically Signed   By: Drusilla Kanner M.D.   On: 08/25/2016 16:15    Procedures Procedures (including critical care time)  DIAGNOSTIC  STUDIES: Oxygen Saturation is 100% on RA, normal by my interpretation.    COORDINATION OF CARE: 3:34 PM Discussed treatment plan with pt at bedside and pt agreed to plan.  Medications Ordered in ED Medications  amoxicillin (AMOXIL) capsule 500 mg (not administered)     Initial Impression / Assessment and Plan / ED Course  I have reviewed the triage vital signs and the nursing notes.  Pertinent labs & imaging results that were available during my care of the patient were reviewed by me and considered in my medical decision making (see chart for details).    I personally performed the services described in this documentation, which was scribed in my presence. The recorded information has been reviewed and is accurate.   Patient is a 66 year old female presenting with several day history of headache, nasal congestion. On exam she has pus behind left ear. Fluid behind right  ear. Thinks likely source. However given her past history which is significant for aneurysm and stroke, we'll get CT brain to rule out any other pathology. Patient's family also requesting labs. We will complete these.  Labs and CT reasuring.  Will treat for OM and have her follow up with PCP.   Final Clinical Impressions(s) / ED Diagnoses   Final diagnoses:  Acute mucoid otitis media of left ear    New Prescriptions New Prescriptions   AMOXICILLIN (AMOXIL) 500 MG CAPSULE    Take 1 capsule (500 mg total) by mouth 3 (three) times daily.  I personally performed the services described in this documentation, which was scribed in my presence. The recorded information has been reviewed and is accurate.      Abelino Derrick, MD 08/25/16 1624

## 2016-08-25 NOTE — ED Notes (Signed)
Patient transported to CT 

## 2016-08-25 NOTE — ED Triage Notes (Addendum)
Patient is non verbal family at the bedside talking for patient, the patient can answer yes and no questions and follow commands without difficulty. The patient reports that the she has a full head headache. Denies any nausea, reports that her vision is blurry since this started. The patient has attempted some over the counter medications, but has not helped/ Patient also reports that her gait is slower and she is having some difficulty walking. Has a history of an anurysm and 2 strokes. The patient has had increased BP and is on newer medications for the HTN

## 2016-10-07 ENCOUNTER — Emergency Department (HOSPITAL_BASED_OUTPATIENT_CLINIC_OR_DEPARTMENT_OTHER)
Admission: EM | Admit: 2016-10-07 | Discharge: 2016-10-07 | Disposition: A | Discharge status: Home / Self Care | Payer: Medicare HMO | Attending: Emergency Medicine | Admitting: Emergency Medicine

## 2016-10-07 ENCOUNTER — Encounter (HOSPITAL_BASED_OUTPATIENT_CLINIC_OR_DEPARTMENT_OTHER): Payer: Self-pay | Admitting: Emergency Medicine

## 2016-10-07 ENCOUNTER — Emergency Department (HOSPITAL_BASED_OUTPATIENT_CLINIC_OR_DEPARTMENT_OTHER): Payer: Medicare HMO

## 2016-10-07 DIAGNOSIS — R079 Chest pain, unspecified: Secondary | ICD-10-CM | POA: Diagnosis present

## 2016-10-07 DIAGNOSIS — Z7982 Long term (current) use of aspirin: Secondary | ICD-10-CM | POA: Diagnosis not present

## 2016-10-07 DIAGNOSIS — I252 Old myocardial infarction: Secondary | ICD-10-CM | POA: Insufficient documentation

## 2016-10-07 DIAGNOSIS — F17200 Nicotine dependence, unspecified, uncomplicated: Secondary | ICD-10-CM | POA: Insufficient documentation

## 2016-10-07 DIAGNOSIS — Z79899 Other long term (current) drug therapy: Secondary | ICD-10-CM | POA: Insufficient documentation

## 2016-10-07 LAB — COMPREHENSIVE METABOLIC PANEL
ALK PHOS: 98 U/L (ref 38–126)
ALT: 19 U/L (ref 14–54)
AST: 19 U/L (ref 15–41)
Albumin: 3.9 g/dL (ref 3.5–5.0)
Anion gap: 9 (ref 5–15)
BUN: 16 mg/dL (ref 6–20)
CALCIUM: 9.1 mg/dL (ref 8.9–10.3)
CO2: 24 mmol/L (ref 22–32)
CREATININE: 0.78 mg/dL (ref 0.44–1.00)
Chloride: 109 mmol/L (ref 101–111)
Glucose, Bld: 99 mg/dL (ref 65–99)
Potassium: 3.6 mmol/L (ref 3.5–5.1)
Sodium: 142 mmol/L (ref 135–145)
Total Bilirubin: 0.5 mg/dL (ref 0.3–1.2)
Total Protein: 7.3 g/dL (ref 6.5–8.1)

## 2016-10-07 LAB — CBC WITH DIFFERENTIAL/PLATELET
BASOS PCT: 0 %
Basophils Absolute: 0 10*3/uL (ref 0.0–0.1)
EOS ABS: 0.3 10*3/uL (ref 0.0–0.7)
Eosinophils Relative: 4 %
HCT: 42.3 % (ref 36.0–46.0)
HEMOGLOBIN: 14.2 g/dL (ref 12.0–15.0)
Lymphocytes Relative: 30 %
Lymphs Abs: 2.3 10*3/uL (ref 0.7–4.0)
MCH: 30.8 pg (ref 26.0–34.0)
MCHC: 33.6 g/dL (ref 30.0–36.0)
MCV: 91.8 fL (ref 78.0–100.0)
MONOS PCT: 7 %
Monocytes Absolute: 0.5 10*3/uL (ref 0.1–1.0)
NEUTROS PCT: 59 %
Neutro Abs: 4.6 10*3/uL (ref 1.7–7.7)
PLATELETS: 331 10*3/uL (ref 150–400)
RBC: 4.61 MIL/uL (ref 3.87–5.11)
RDW: 14.6 % (ref 11.5–15.5)
WBC: 7.7 10*3/uL (ref 4.0–10.5)

## 2016-10-07 LAB — TROPONIN I: Troponin I: <0.03 ng/mL (ref <0.03)

## 2016-10-07 MED ORDER — ASPIRIN 81 MG PO CHEW
324.0000 mg | CHEWABLE_TABLET | Freq: Once | ORAL | Status: AC
  Administered 2016-10-07: 324 mg via ORAL
  Filled 2016-10-07: qty 4

## 2016-10-07 NOTE — ED Notes (Signed)
ED Provider at bedside. 

## 2016-10-07 NOTE — ED Notes (Signed)
Attempted IV x 2, unsuccessful, tol well.  

## 2016-10-07 NOTE — ED Triage Notes (Signed)
Patient called her family and communicated that she was having chest pain. The patien was crying in pain when they arrived. Patient also had "such a hihg blood pressure that the machine would not read" patient and family are anxious in triage

## 2016-10-07 NOTE — ED Provider Notes (Signed)
[facility] Note   CSN: [id number] Arrival date & time: 10/07/16  1750   By signing my name below, I, Clarisse Gouge, attest that this documentation has been prepared under the direction and in the presence of Taevyn Hausen, PA-C. Electronically signed, Clarisse Gouge, ED Scribe. 10/07/16. 6:34 PM.   History   Chief Complaint Chief Complaint  Patient presents with  . Chest Pain   LEVEL 5 CAVEAT: HPI and ROS limited due to aphasia   The history is provided by medical records and a relative. No language interpreter was used.    Melinda Hinton is a 66 y.o. female presenting to the Emergency Department concerning L chest pain reported ~30 minutes ago. Patient's pain arose while at rest. She indicates that it was moderate to severe at its worst and lasted for approximately 20 minutes. The pain was sharp and nonradiating. She states that she had episodes of chest pain earlier today as well. She indicates that she has not had this pain before. Patient is accompanied by her son and daughter-in-law. Family states that the patient called them using a predetermined system that was put in place due to the patient's aphasia from previous stroke. They're also concerned about the patient's blood pressure because her home wrist type blood pressure cuff would not log an accurate reading this morning. Patient communicates through gestures and responds accurately to yes or no questions. She is assisted by her family. She indicates she is currently pain-free. She denies dizziness, N/V/D, fever/chills, cough, abdominal pain, shortness of breath, peripheral edema, or any other complaints. She denies these complaints both currently and during the episodes of chest pain.  A review the patient's history indicates a heart attack, however, family states this was not accurately diagnosed. She has been followed by a cardiologist in the [place] and has since been cleared from their care since last year.  They are not set on seeing this cardiology practice again, if follow up is necessary.  Past Medical History:  Diagnosis Date  . Aneurysm (HCC)   . Expressive aphasia   . Heart attack (HCC)   . Stroke [facility])     There are no active problems to display for this patient.   Past Surgical History:  Procedure Laterality Date  . ANEURYSM COILING      OB History    No data available       Home Medications    Prior to Admission medications   Medication Sig Start Date End Date Taking? Authorizing Provider  amitriptyline (ELAVIL) 25 MG tablet Take 25 mg by mouth at bedtime.    [provider]  amoxicillin (AMOXIL) 500 MG capsule Take 1 capsule (500 mg total) by mouth 3 (three) times daily. 08/25/16   Mackuen, Courteney Lyn, MD  aspirin 81 MG tablet Take 81 mg by mouth daily.    [provider]  atorvastatin (LIPITOR) 80 MG tablet Take 80 mg by mouth daily.    [provider]  carvedilol (COREG) 6.25 MG tablet Take 6.25 mg by mouth 2 (two) times daily with a meal.    [provider]  cephALEXin (KEFLEX) 500 MG capsule Take 1 capsule (500 mg total) by mouth 3 (three) times daily. 10/21/15   Raeford Razor, MD  doxylamine, Sleep, (UNISOM) 25 MG tablet Take 25 mg by mouth at bedtime as needed.    [provider]  hydrOXYzine (VISTARIL) 25 MG capsule Take 25 mg by mouth 3 (three) times daily as needed.  [provider]  levothyroxine (SYNTHROID, LEVOTHROID) 50 MCG tablet Take 50 mcg by mouth daily before breakfast.    [provider]  lisinopril (PRINIVIL,ZESTRIL) 20 MG tablet Take 20 mg by mouth daily.    [provider]  nitrofurantoin, macrocrystal-monohydrate, (MACROBID) 100 MG capsule Take 100 mg by mouth 2 (two) times daily.    [provider]  topiramate (TOPAMAX) 50 MG tablet Take 50 mg by mouth 2 (two) times daily.    [provider]    Family History History reviewed. No pertinent family  history.  Social History Social History  Substance Use Topics  . Smoking status: Current Every Day Smoker  . Smokeless tobacco: Never Used  . Alcohol use No     Allergies   Patient has no known allergies.   Review of Systems Review of Systems  Unable to perform ROS: Patient nonverbal  Constitutional: Negative for chills, diaphoresis and fever.  Eyes: Negative for visual disturbance.  Respiratory: Negative for cough and shortness of breath.   Cardiovascular: Positive for chest pain (resolved). Negative for leg swelling.  Gastrointestinal: Negative for abdominal pain, diarrhea, nausea and vomiting.  Musculoskeletal: Negative for back pain.  Neurological: Negative for dizziness, syncope, weakness, light-headedness and numbness.  All other systems reviewed and are negative.    Physical Exam Updated Vital Signs BP (!) 184/102 (BP Location: Right Arm)   Pulse 79   Temp 98.4 F (36.9 C) (Oral)   Resp (!) 22   Ht 5\' 3"  (1.6 m)   Wt 135 lb (61.2 kg)   LMP 04/24/2012   SpO2 100%   BMI 23.91 kg/m   Physical Exam  Constitutional: She appears well-developed and well-nourished. No distress.  HENT:  Head: Normocephalic and atraumatic.  Eyes: Conjunctivae are normal.  Neck: Neck supple.  Cardiovascular: Normal rate, regular rhythm, normal heart sounds and intact distal pulses.   Pulses:      Radial pulses are 2+ on the right side, and 2+ on the left side.       Dorsalis pedis pulses are 2+ on the right side, and 2+ on the left side.       Posterior tibial pulses are 2+ on the right side, and 2+ on the left side.  Pulmonary/Chest: Effort normal and breath sounds normal. No respiratory distress. She exhibits no tenderness.  Abdominal: Soft. There is no tenderness. There is no guarding.  Musculoskeletal: She exhibits no edema.  Lymphadenopathy:    She has no cervical adenopathy.  Neurological: She is alert.  Skin: Skin is warm and dry. She is not diaphoretic.  Psychiatric:  She has a normal mood and affect. Her behavior is normal.  Nursing note and vitals reviewed.    ED Treatments / Results  DIAGNOSTIC STUDIES: Oxygen Saturation is 100% on RA, NL by my interpretation.    COORDINATION OF CARE: 6:33 PM-Discussed next steps with family members present. They verbalized understanding and is agreeable with the plan. Will order blood work and labs. Pending admission.   Labs (all labs ordered are listed, but only abnormal results are displayed) Labs Reviewed  TROPONIN I  COMPREHENSIVE METABOLIC PANEL  CBC WITH DIFFERENTIAL/PLATELET  TROPONIN I    EKG  EKG Interpretation None      EKG would not cross over into MUSE system.   EKG findings: No noted arrhythmia. EKG shows sinus rhythm with no interval abnormalities such as QT prolongation or WPW. There are no findings to suggest Brugada syndrome. Cardiac monitoring in the emergency  department reveals not tachycardic or bradycardic dysrhythmia. Hypertrophic cardiomyopathy was considered, but there are no clear historical elements pointing toward this. EKG is not suggestive. The QRS voltages is not extremely large and there are no suggestive Q waves.  Radiology Dg Chest 2 View  Result Date: 10/07/2016 CLINICAL DATA:  66 y/o female with chest pain, found to be hypertensive. Smoker. EXAM: CHEST  2 VIEW COMPARISON:  10/21/2015 and earlier. FINDINGS: Stable lung volumes. Mediastinal contours are stable and within normal limits. No pneumothorax or pulmonary edema. No pleural effusion. Mild streaky lung base opacity similar to the prior study compatible with lung base atelectasis or scarring. No other confluent pulmonary opacity. Visualized tracheal air column is within normal limits. Stable visualized osseous structures. Negative visible bowel gas pattern. Calcified aortic atherosclerosis. IMPRESSION: 1.  No acute cardiopulmonary abnormality. 2.  Calcified aortic atherosclerosis. Electronically Signed   By: Odessa FlemingH  Hall M.D.    On: 10/07/2016 18:53    Procedures Procedures (including critical care time)  Medications Ordered in ED Medications  aspirin chewable tablet 324 mg (324 mg Oral Given 10/07/16 1938)     Initial Impression / Assessment and Plan / ED Course  I have reviewed the triage vital signs and the nursing notes.  Pertinent labs & imaging results that were available during my care of the patient were reviewed by me and considered in my medical decision making (see chart for details).  Clinical Course as of Oct 09 932  Tue Oct 07, 2016  1905 Patient continues to be pain-free. Denies additional complaints.   [SJ]  2001 Reviewed patient's lab results and imaging with the patient. Patient is adamantly against admission. Purpose of chest pain observation and subsequent testing was discussed with the patient. Patient indicated understanding, but does not want admission.   [SJ]    Clinical Course User Index [SJ] Rosenda Geffrard C, PA-C       Patient presents with now resolved episode of chest pain. HEART score is 4, indicating moderate risk for a cardiac event. Wells criteria score is 0, indicating low risk for PE. Negative delta troponins. Patient has no risk factors for PE and my suspicion for PE is very low. My suspicion for thoracic aortic dissection is also very low. Additionally, patient has no acute abnormalities on chest x-ray, has equal pulses bilaterally, pain description inconsistent with dissection, and no baseline risk factors. Admission for chest pain observation was recommended. Patient adamantly opposed to this. She is alert and oriented and able to make her own decisions. She has agreed to have close follow-up with both her PCP and cardiology. Her family vows to assist her in this matter. Close return precautions discussed. Patient and family voice understanding of these instructions and are comfortable with discharge.   Findings and plan of care discussed with Alvira MondayErin Schlossman, MD. Dr.  Dalene SeltzerSchlossman personally evaluated and examined this patient.  Final Clinical Impressions(s) / ED Diagnoses   Final diagnoses:  Chest pain, unspecified type    New Prescriptions Discharge Medication List as of 10/07/2016  9:49 PM    I personally performed the services described in this documentation, which was scribed in my presence. The recorded information has been reviewed and is accurate.   Anselm PancoastJoy, Sabryna Lahm C, PA-C 10/09/16 [id number]    Alvira MondaySchlossman, Erin, MD 10/12/16 2151

## 2016-10-07 NOTE — Discharge Instructions (Addendum)
You have been seen today following an instance of chest pain. You were observed in the ED without recurrence. Although your labs and imaging were reassuring, we preferred to have you stay overnight for observation, but you have declined. It is highly recommended that you follow up with cardiology and your primary care provider on this matter. Should symptoms recur, [facility].

## 2016-10-07 NOTE — ED Notes (Signed)
Patient transported to X-ray 

## 2016-12-02 ENCOUNTER — Emergency Department (HOSPITAL_COMMUNITY): Payer: Medicare HMO

## 2016-12-02 ENCOUNTER — Encounter (HOSPITAL_COMMUNITY): Payer: Self-pay | Admitting: Emergency Medicine

## 2016-12-02 ENCOUNTER — Emergency Department (HOSPITAL_COMMUNITY)
Admission: EM | Admit: 2016-12-02 | Discharge: 2016-12-02 | Disposition: A | Discharge status: Home / Self Care | Payer: Medicare HMO | Attending: Emergency Medicine | Admitting: Emergency Medicine

## 2016-12-02 DIAGNOSIS — I252 Old myocardial infarction: Secondary | ICD-10-CM | POA: Diagnosis not present

## 2016-12-02 DIAGNOSIS — Z7982 Long term (current) use of aspirin: Secondary | ICD-10-CM | POA: Diagnosis not present

## 2016-12-02 DIAGNOSIS — Z79899 Other long term (current) drug therapy: Secondary | ICD-10-CM | POA: Insufficient documentation

## 2016-12-02 DIAGNOSIS — Z8673 Personal history of transient ischemic attack (TIA), and cerebral infarction without residual deficits: Secondary | ICD-10-CM | POA: Insufficient documentation

## 2016-12-02 DIAGNOSIS — F17200 Nicotine dependence, unspecified, uncomplicated: Secondary | ICD-10-CM | POA: Diagnosis not present

## 2016-12-02 DIAGNOSIS — R519 Headache, unspecified: Secondary | ICD-10-CM

## 2016-12-02 DIAGNOSIS — R51 Headache: Secondary | ICD-10-CM | POA: Diagnosis not present

## 2016-12-02 LAB — CBC WITH DIFFERENTIAL/PLATELET
Basophils Absolute: 0 10*3/uL (ref 0.0–0.1)
Basophils Relative: 0 %
EOS PCT: 3 %
Eosinophils Absolute: 0.3 10*3/uL (ref 0.0–0.7)
HEMATOCRIT: 41.3 % (ref 36.0–46.0)
Hemoglobin: 13.4 g/dL (ref 12.0–15.0)
LYMPHS ABS: 2.1 10*3/uL (ref 0.7–4.0)
LYMPHS PCT: 27 %
MCH: 30 pg (ref 26.0–34.0)
MCHC: 32.4 g/dL (ref 30.0–36.0)
MCV: 92.6 fL (ref 78.0–100.0)
MONO ABS: 0.5 10*3/uL (ref 0.1–1.0)
MONOS PCT: 6 %
Neutro Abs: 5.1 10*3/uL (ref 1.7–7.7)
Neutrophils Relative %: 64 %
PLATELETS: 297 10*3/uL (ref 150–400)
RBC: 4.46 MIL/uL (ref 3.87–5.11)
RDW: 14.6 % (ref 11.5–15.5)
WBC: 8 10*3/uL (ref 4.0–10.5)

## 2016-12-02 LAB — BASIC METABOLIC PANEL
ANION GAP: 6 (ref 5–15)
BUN: 15 mg/dL (ref 6–20)
CALCIUM: 8.9 mg/dL (ref 8.9–10.3)
CO2: 23 mmol/L (ref 22–32)
Chloride: 115 mmol/L — ABNORMAL HIGH (ref 101–111)
Creatinine, Ser: 0.77 mg/dL (ref 0.44–1.00)
GFR calc Af Amer: >60 mL/min (ref >60)
GFR calc non Af Amer: >60 mL/min (ref >60)
Glucose, Bld: 105 mg/dL — ABNORMAL HIGH (ref 65–99)
POTASSIUM: 3.4 mmol/L — AB (ref 3.5–5.1)
SODIUM: 144 mmol/L (ref 135–145)

## 2016-12-02 LAB — CK: CK TOTAL: 48 U/L (ref 38–234)

## 2016-12-02 MED ORDER — ACETAMINOPHEN 325 MG PO TABS: 650.0000 mg | ORAL_TABLET | Freq: Once | ORAL | Status: DC

## 2016-12-02 NOTE — Discharge Instructions (Signed)
The lab work and imaging has been reassuring today. It is important that she follow-up with her primary care doctor this week in regards to today's visit. If she develops any worsening symptoms or have a significant headache again return to the ED.

## 2016-12-02 NOTE — ED Notes (Signed)
Patient given discharge instructions and verbalized understanding.  Patient stable to discharge at this time.  Patient is alert and oriented to baseline.  No distressed noted at this time.  All belongings taken with the patient at discharge.   

## 2016-12-02 NOTE — ED Provider Notes (Signed)
[facility] DEPT Provider Note   CSN: [id number] Arrival date & time: 12/02/16  1446     History   Chief Complaint Chief Complaint  Patient presents with  . Altered Mental Status  . Fall    HPI Jaleiyah Berstler is a 66 y.o. female.  HPI A 66 year old Caucasian female past medical history significant for aneurysm, CVA that presents to the emergency department today by EMS from home with family at bedside for evaluation of possible fall and headache. Family at bedside states that when they went to patient's house today she was on the floor at approximately 1340. Family states that she was complaining of a left-sided headache. Unsure the patient fell. They stated they asked the patient and she said that she laid down on the floor. States that she was on the floor for approximately 45 minutes. They state that patient never had any altered mental status. Was able to answer questions appropriately. There were concerned because patient was having a headache which is what I can to the ED for evaluation. The patient states the headache has been intermittent. States the headache is approximately a 4/10. The patient stated that she did not fall. She does have right-sided deficits at baseline from her prior CVA and aneurysm. She also is not able to speak but is able to understand and nod yes or no. Family at bedside states the patient is at her baseline. Has been ambulatory since the event. She denies any other injury including neck pain, back pain, abdominal pain, chest pain, lightheadedness, dizziness, vision changes, paresthesias, saddle paresthesias, urinary symptoms, change in bowel habits. Past Medical History:  Diagnosis Date  . Aneurysm (HCC)   . Expressive aphasia   . Heart attack (HCC)   . Stroke [facility])     There are no active problems to display for this patient.   Past Surgical History:  Procedure Laterality Date  . ANEURYSM COILING      OB History    No data available        Home Medications    Prior to Admission medications   Medication Sig Start Date End Date Taking? Authorizing Provider  amLODipine (NORVASC) 5 MG tablet Take 5 mg by mouth at bedtime. 11/07/16  Yes [provider]  aspirin 81 MG tablet Take 81 mg by mouth at bedtime.    Yes [provider]  atorvastatin (LIPITOR) 80 MG tablet Take 80 mg by mouth at bedtime.    Yes [provider]  carvedilol (COREG) 6.25 MG tablet Take 6.25 mg by mouth 2 (two) times daily with a meal.   Yes [provider]  levothyroxine (SYNTHROID, LEVOTHROID) 50 MCG tablet Take 50 mcg by mouth daily before breakfast.   Yes [provider]  lisinopril (PRINIVIL,ZESTRIL) 20 MG tablet Take 20 mg by mouth at bedtime.    Yes [provider]  Multiple Vitamins-Minerals (MULTIVITAL) tablet Take 1 tablet by mouth daily.   Yes [provider]  psyllium (TGT PSYLLIUM FIBER) 0.52 g capsule Take 1 capsule by mouth at bedtime.   Yes [provider]  topiramate (TOPAMAX) 50 MG tablet Take 50 mg by mouth 2 (two) times daily.   Yes [provider]  amoxicillin (AMOXIL) 500 MG capsule Take 1 capsule (500 mg total) by mouth 3 (three) times daily. Patient not taking: Reported on 12/02/2016 08/25/16   Mackuen, Courteney Lyn, MD  cephALEXin (KEFLEX) 500 MG capsule Take 1 capsule (500 mg total) by mouth 3 (three) times daily.  Patient not taking: Reported on 12/02/2016 10/21/15   Raeford Razor, MD    Family History History reviewed. No pertinent family history.  Social History Social History  Substance Use Topics  . Smoking status: Current Every Day Smoker  . Smokeless tobacco: Never Used  . Alcohol use No     Allergies   Patient has no known allergies.   Review of Systems Review of Systems  Constitutional: Negative for chills and fever.  HENT: Negative for congestion.   Eyes: Negative for visual disturbance.  Respiratory: Negative for cough and  shortness of breath.   Cardiovascular: Negative for chest pain.  Gastrointestinal: Negative for abdominal pain, diarrhea, nausea and vomiting.  Genitourinary: Negative for dysuria, frequency, hematuria and urgency.  Musculoskeletal: Negative for arthralgias and myalgias.  Skin: Negative for rash.  Neurological: Positive for headaches. Negative for dizziness, syncope, weakness, light-headedness and numbness.  Psychiatric/Behavioral: Negative for sleep disturbance. The patient is not nervous/anxious.      Physical Exam Updated Vital Signs BP 114/79   Pulse 60   Temp 98.3 F (36.8 C) (Oral)   Resp 14   LMP 04/24/2012   SpO2 97%   Physical Exam  Constitutional: She is oriented to person, place, and time. She appears well-developed and well-nourished.  Non-toxic appearance. No distress.  HENT:  Head: Normocephalic and atraumatic.  Nose: Nose normal.  Mouth/Throat: Oropharynx is clear and moist.  Eyes: Pupils are equal, round, and reactive to light. Conjunctivae are normal. Right eye exhibits no discharge. Left eye exhibits no discharge.  Neck: Normal range of motion. Neck supple.  Cardiovascular: Normal rate, regular rhythm, normal heart sounds and intact distal pulses.   Pulmonary/Chest: Effort normal and breath sounds normal. No respiratory distress. She exhibits no tenderness.  Abdominal: Soft. Bowel sounds are normal. There is no tenderness. There is no rebound and no guarding.  Musculoskeletal: Normal range of motion. She exhibits no tenderness.  Lymphadenopathy:    She has no cervical adenopathy.  Neurological: She is alert and oriented to person, place, and time.  The patient is alert, attentive, and oriented x 3. Pt does not speak at baseline but is able to not heard head yes and no to questions.  Cranial nerve II-VII grossly intact. Negative pronator drift. Sensation intact. Strength 5/5 in all extremities. Reflexes 2+ and symmetric at biceps, triceps, knees, and ankles.  Rapid alternating movement and fine finger movements intact. Romberg is absent. Posture and gait normal.    Skin: Skin is warm and dry. Capillary refill takes less than 2 seconds.  Psychiatric: Her behavior is normal. Judgment and thought content normal.  Nursing note and vitals reviewed.    ED Treatments / Results  Labs (all labs ordered are listed, but only abnormal results are displayed) Labs Reviewed  BASIC METABOLIC PANEL - Abnormal; Notable for the following:       Result Value   Potassium 3.4 (*)    Chloride 115 (*)    Glucose, Bld 105 (*)    All other components within normal limits  CBC WITH DIFFERENTIAL/PLATELET  CK    EKG  EKG Interpretation  Date/Time:  Tuesday December 02 2016 15:46:42 EDT Ventricular Rate:  65 PR Interval:    QRS Duration: 86 QT Interval:  458 QTC Calculation: 477 R Axis:   19 Text Interpretation:  Sinus rhythm Prolonged PR interval Low voltage, precordial leads T wave changes similar to July 2018 Confirmed by Pricilla Loveless [phone]) on 12/02/2016 4:34:37 PM  Radiology Ct Head Wo Contrast  Result Date: 12/02/2016 CLINICAL DATA:  Left-sided headache after being found down earlier today. EXAM: CT HEAD WITHOUT CONTRAST CT CERVICAL SPINE WITHOUT CONTRAST TECHNIQUE: Multidetector CT imaging of the head and cervical spine was performed following the standard protocol without intravenous contrast. Multiplanar CT image reconstructions of the cervical spine were also generated. COMPARISON:  CT head dated Aug 25, 2016. FINDINGS: CT HEAD FINDINGS Brain: Significant encephalomalacia in the left cerebral hemisphere is similar to prior study. No evidence of acute infarction, hemorrhage, hydrocephalus, extra-axial collection or mass lesion/mass effect. Vascular: Prior coiling of the left ICA aneurysm. No hyperdense vessel. Skull: Prior left craniotomy.  No fracture or focal lesion. Sinuses/Orbits: The bilateral paranasal sinuses and mastoid air cells are  clear. The orbits are unremarkable. Other: None. CT CERVICAL SPINE FINDINGS Alignment: Straightening and slight reversal of the normal cervical lordosis. Otherwise normal. Skull base and vertebrae: No acute cervical spine fracture. Minimal anterior wedging of the T1 and T2 vertebral bodies. No primary bone lesion or focal pathologic process. Soft tissues and spinal canal: No prevertebral fluid or swelling. No visible canal hematoma. Disc levels:  Negative. Upper chest: Moderate bilateral centrilobular emphysema. Atherosclerotic vascular calcifications of the aortic arch and branch vessels. Other: None. IMPRESSION: 1. No acute intracranial abnormality. 2. 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 3.  No acute cervical spine fracture. 4. Minimal age-indeterminate anterior wedging of the T1 and T2 vertebral bodies. Correlate with point tenderness. Electronically Signed   By: Obie Dredge M.D.   On: 12/02/2016 17:28   Ct Cervical Spine Wo Contrast  Result Date: 12/02/2016 CLINICAL DATA:  Left-sided headache after being found down earlier today. EXAM: CT HEAD WITHOUT CONTRAST CT CERVICAL SPINE WITHOUT CONTRAST TECHNIQUE: Multidetector CT imaging of the head and cervical spine was performed following the standard protocol without intravenous contrast. Multiplanar CT image reconstructions of the cervical spine were also generated. COMPARISON:  CT head dated Aug 25, 2016. FINDINGS: CT HEAD FINDINGS Brain: Significant encephalomalacia in the left cerebral hemisphere is similar to prior study. No evidence of acute infarction, hemorrhage, hydrocephalus, extra-axial collection or mass lesion/mass effect. Vascular: Prior coiling of the left ICA aneurysm. No hyperdense vessel. Skull: Prior left craniotomy.  No fracture or focal lesion. Sinuses/Orbits: The bilateral paranasal sinuses and mastoid air cells are clear. The orbits are unremarkable. Other: None. CT CERVICAL SPINE FINDINGS Alignment:  Straightening and slight reversal of the normal cervical lordosis. Otherwise normal. Skull base and vertebrae: No acute cervical spine fracture. Minimal anterior wedging of the T1 and T2 vertebral bodies. No primary bone lesion or focal pathologic process. Soft tissues and spinal canal: No prevertebral fluid or swelling. No visible canal hematoma. Disc levels:  Negative. Upper chest: Moderate bilateral centrilobular emphysema. Atherosclerotic vascular calcifications of the aortic arch and branch vessels. Other: None. IMPRESSION: 1. No acute intracranial abnormality. 2. 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 3.  No acute cervical spine fracture. 4. Minimal age-indeterminate anterior wedging of the T1 and T2 vertebral bodies. Correlate with point tenderness. Electronically Signed   By: Obie Dredge M.D.   On: 12/02/2016 17:28    Procedures Procedures (including critical care time)  Medications Ordered in ED Medications  acetaminophen (TYLENOL) tablet 650 mg (not administered)     Initial Impression / Assessment and Plan / ED Course  I have reviewed the triage vital signs and the nursing notes.  Pertinent labs & imaging results that were available during my care of the  patient were reviewed by me and considered in my medical decision making (see chart for details).     Patient presents to the emergency department today after being brought in by EMS with family at bedside with complaints of a possible fall. The patient also states that she has had left-sided headache that has been intermittent. Patient with history of aneurysm and family was concerned. Patient is nonverbal at baseline but able to nod yes and no to answers. On exam patient is overall well-appearing and nontoxic. Vital signs are reassuring.  On exam patient has no focal neuro deficits. She is ambulatory with normal gait. The patient denies any headache at this time. Pt headache started at 1300. Ct  scan was obtained within a 6 hour window that was unremarkable. Doubt acute rupture of aneurysm. Patient denies any pain at this time.  Labwork is reassuring. EKG is unremarkable. CK was normal. Family still states that pt is at baseline.   Patient feels much improved and ready for discharge. Denies any headache at this time. Encouraged follow-up with her primary care neurologist. Patient was seen and evaluated by Dr. Criss Alvine who is agreeable to the above plan.   Pt is hemodynamically stable, in NAD, & able to ambulate in the ED. Evaluation does not show pathology that would require ongoing emergent intervention or inpatient treatment. I explained the diagnosis to the patient. Pain has been managed & has no complaints prior to dc. Pt is comfortable with above plan and is stable for discharge at this time. All questions were answered prior to disposition. Strict return precautions for f/u to the ED were discussed. Encouraged follow up with PCP.     Final Clinical Impressions(s) / ED Diagnoses   Final diagnoses:  Nonintractable headache, unspecified chronicity pattern, unspecified headache type    New Prescriptions New Prescriptions   No medications on file     Wallace Keller 12/02/16 Ala Bent, MD 12/03/16 [phone]

## 2016-12-02 NOTE — ED Triage Notes (Addendum)
Per EMS: [facility]W 12/01/2016 2200.  Pt found on floor by family today at 1340.  Pt c/o of left sided headache.  Denies any back, neck, or hip pain.  Family states pt has generalized weakness especially on right side due to a previous stroke.  Pt denies taking blood thinners.  No LOC reported. PTA vitals:  146/96, HR 60, RR 18, CBG 135.  EKG unremarkable

## 2018-07-10 IMAGING — CT CT HEAD W/O CM
3 series · 15 of 46 positions shown, 18 images · non-contrast
Comparison: Head CT scan 07/27/2015.  Brain MRI 06/02/2015.

CLINICAL DATA: Diffuse headache for several days.  Near pain.

EXAM:
CT HEAD WITHOUT CONTRAST
TECHNIQUE: Contiguous axial images were obtained from the base of the skull
through the vertex without intravenous contrast.

[Series 3: head wo · axial · 0.39mm/px · z∈[-98,+22]mm · 9 of 29 slices shown, 12 images]
[im 3/29  brain]
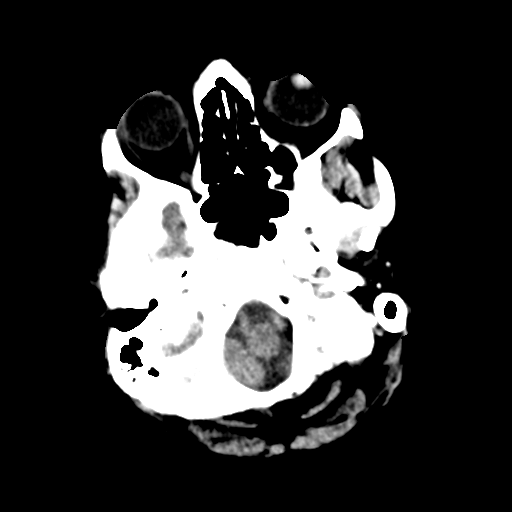
[im 3/29  bone]
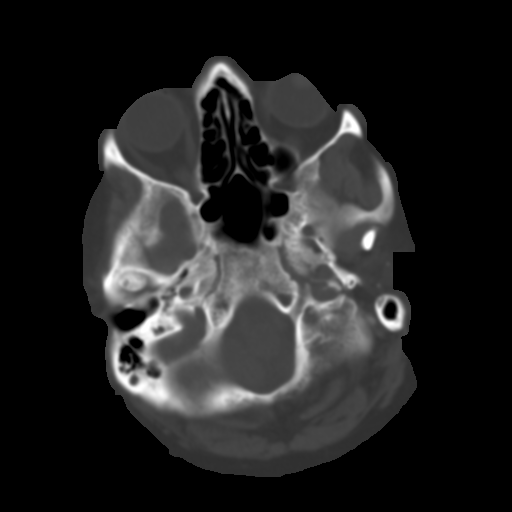
[im 6/29  brain]
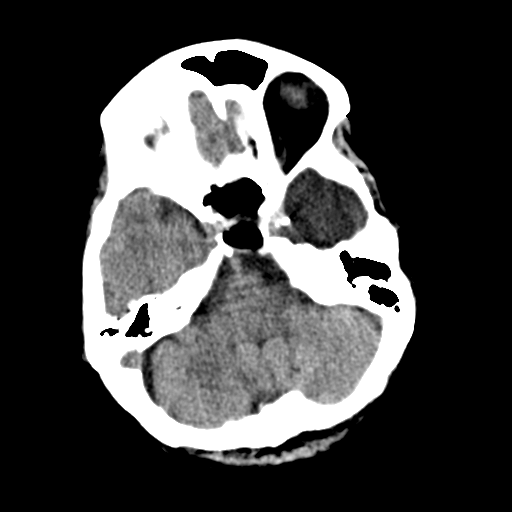
[im 9/29  brain]
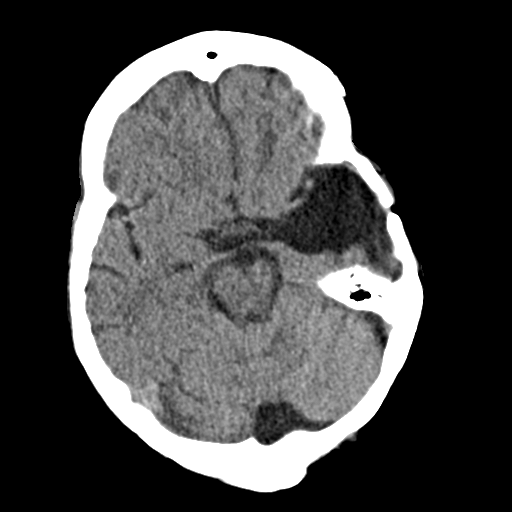
[im 12/29  brain]
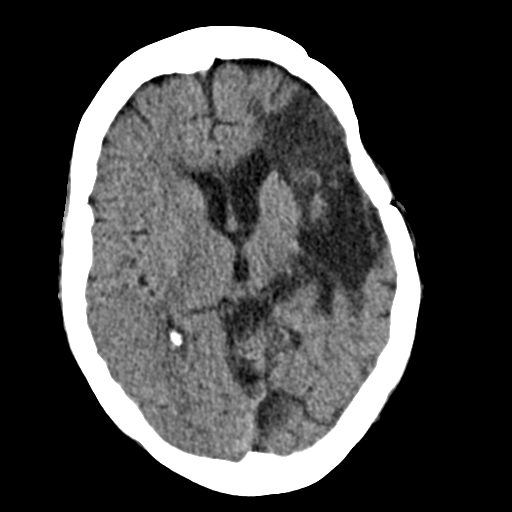
[im 15/29  brain]
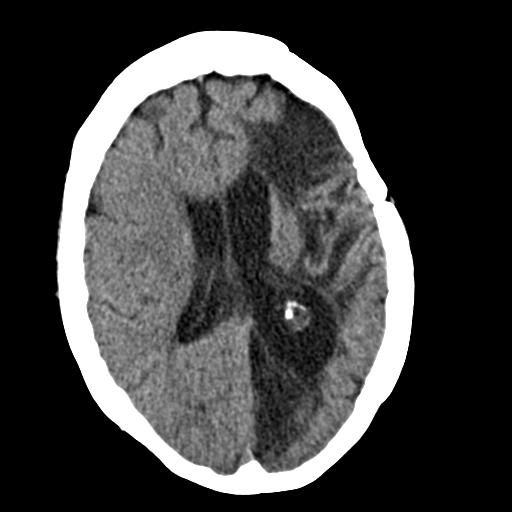
[im 15/29  bone]
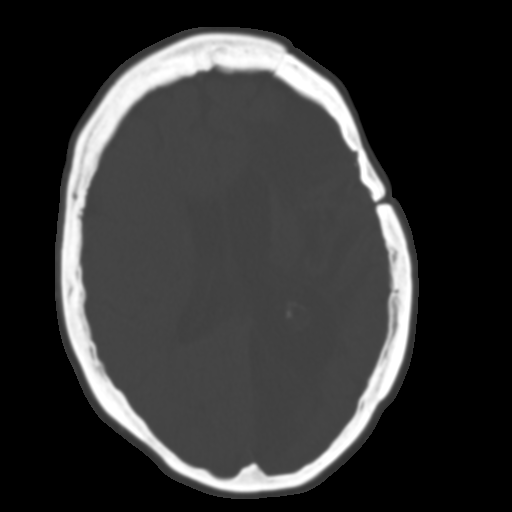
[im 18/29  brain]
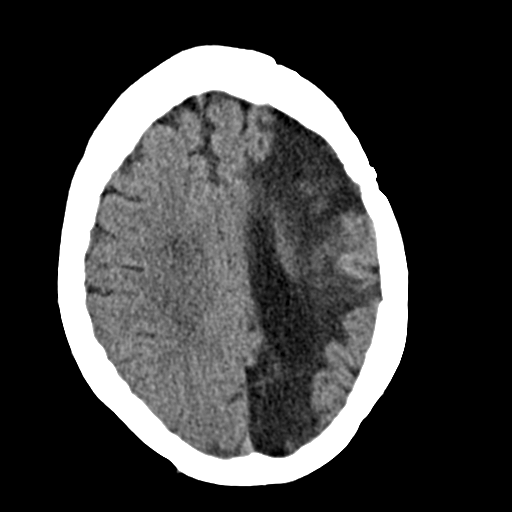
[im 21/29  brain]
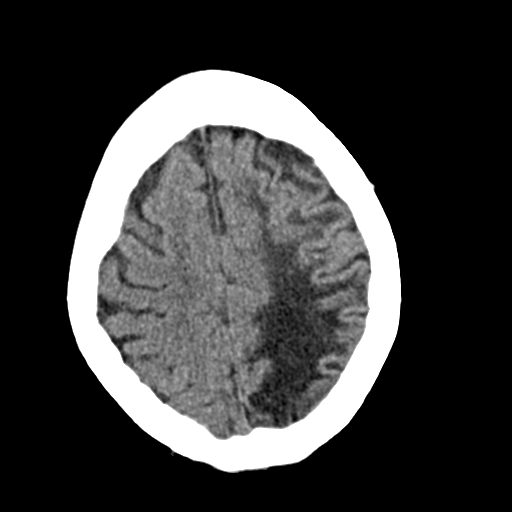
[im 24/29  brain]
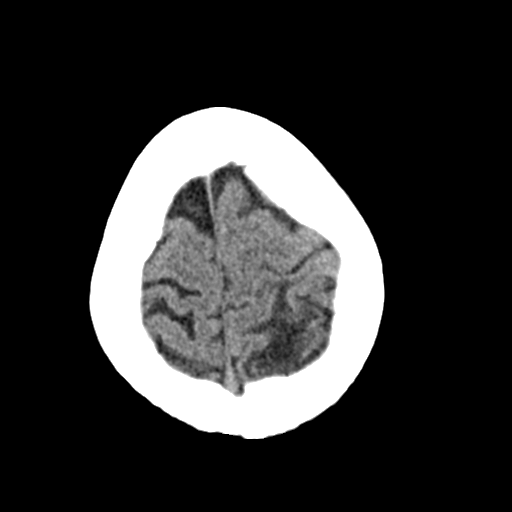
[im 27/29  brain]
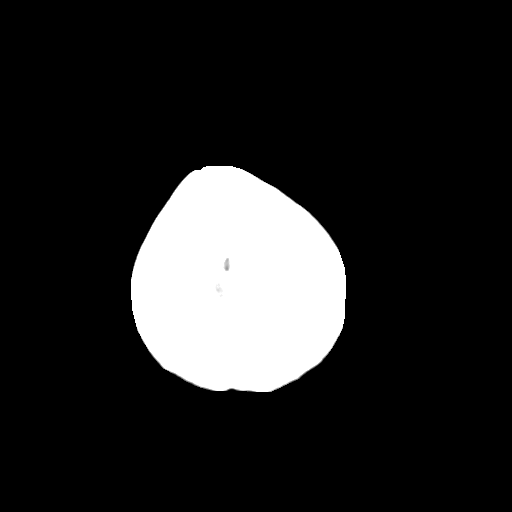
[im 27/29  bone]
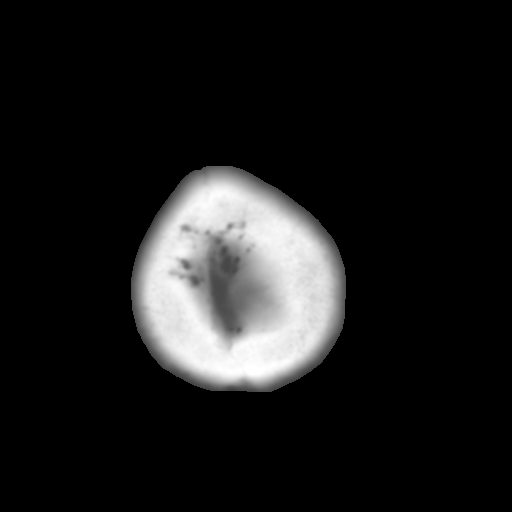

[Series 5: coronal soft · coronal · 0.35mm/px · 3 of 64 slices shown]
[im 22/64  brain]
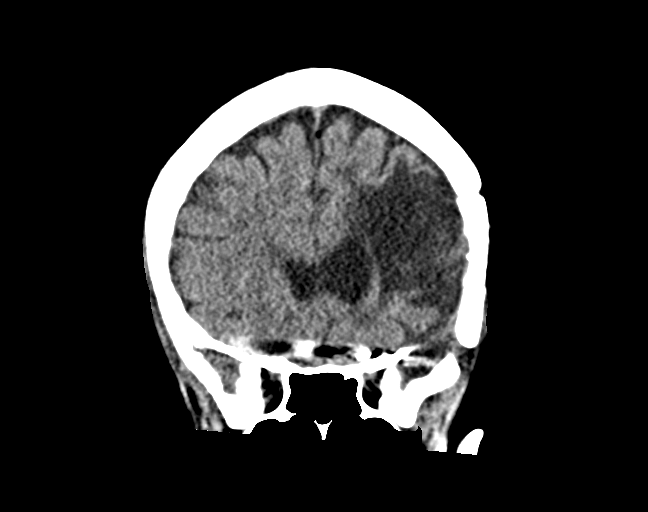
[im 29/64  brain]
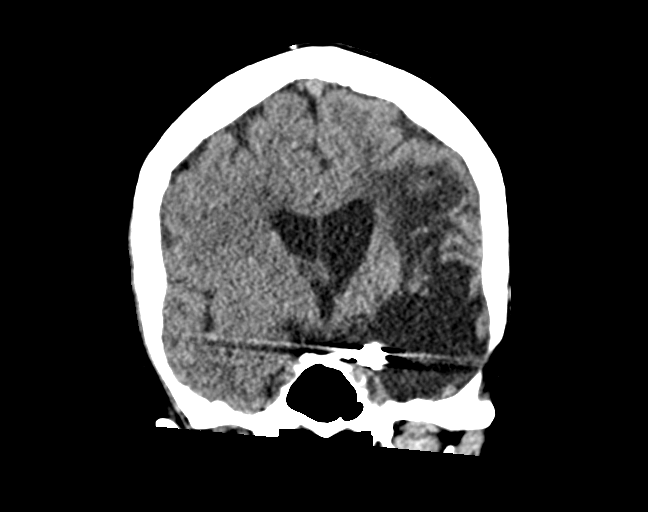
[im 36/64  brain]
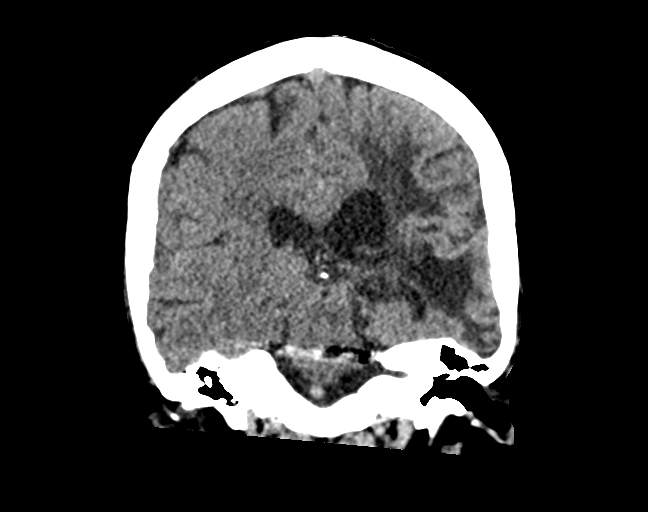

[Series 6: sag soft · sagittal · 0.33mm/px · 3 of 50 slices shown]
[im 17/50  brain]
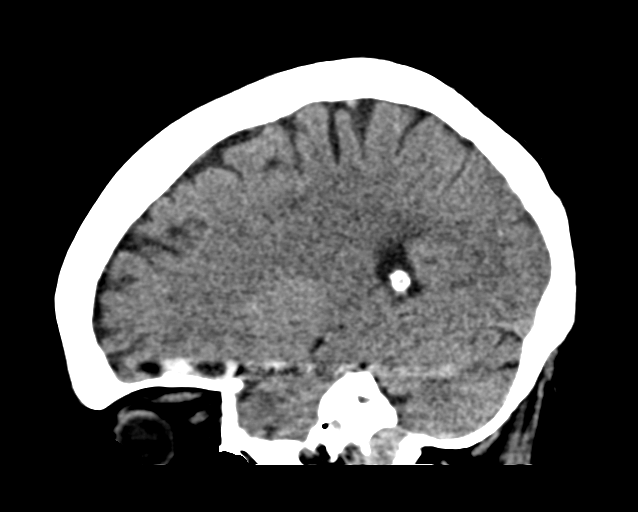
[im 25/50  brain]
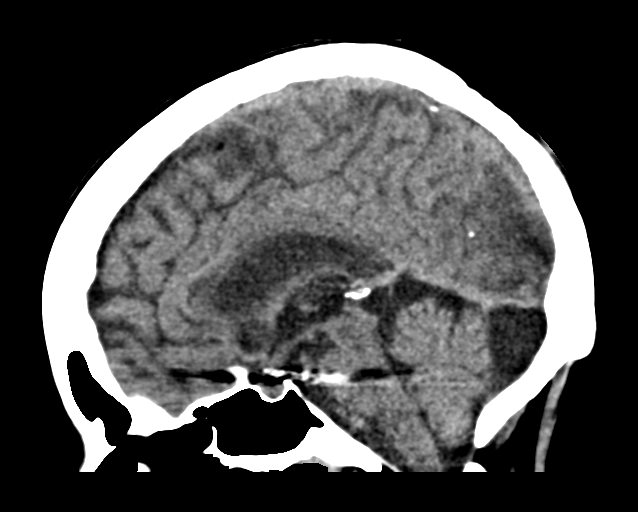
[im 33/50  brain]
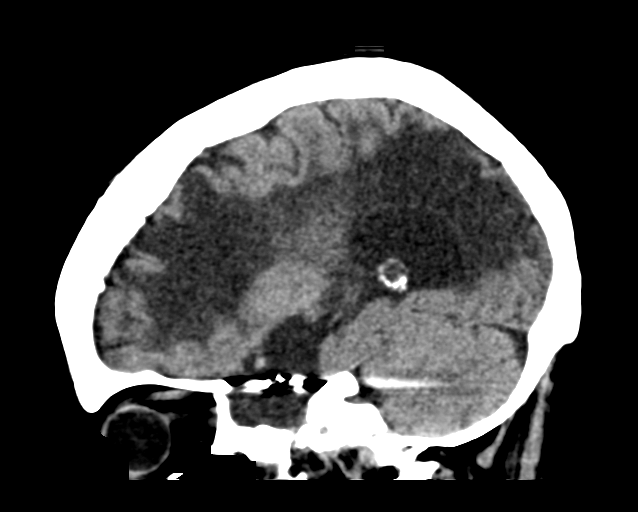

[15 of 46 positions shown; findings below may reference images not displayed]

FINDINGS: Brain: Extensive encephalomalacia in the left cerebral hemisphere is
unchanged. No evidence of acute infarction, hemorrhage, midline
shift or abnormal extra-axial fluid collection. No abnormality is
seen at the site of the patient's known right periatrial AVM.

Vascular: Status post coiling of a left ICA aneurysm. No acute
abnormality.

Skull: Left craniotomy defect is again seen.  No acute abnormality.

Sinuses/Orbits: Clear.

Other: None.
IMPRESSION: No acute abnormality.

Status post left ICA aneurysm coiling with extensive
encephalomalacia throughout the left cerebral hemisphere.

No abnormality is seen at the mid site of the patient's known right
periatrial AVM.

## 2019-03-02 ENCOUNTER — Emergency Department (HOSPITAL_BASED_OUTPATIENT_CLINIC_OR_DEPARTMENT_OTHER)
Admission: EM | Admit: 2019-03-02 | Discharge: 2019-03-03 | Disposition: A | Discharge status: Home / Self Care | Payer: Medicare HMO | Attending: Emergency Medicine | Admitting: Emergency Medicine

## 2019-03-02 ENCOUNTER — Other Ambulatory Visit: Payer: Self-pay

## 2019-03-02 ENCOUNTER — Encounter (HOSPITAL_BASED_OUTPATIENT_CLINIC_OR_DEPARTMENT_OTHER): Payer: Self-pay | Admitting: *Deleted

## 2019-03-02 DIAGNOSIS — F17210 Nicotine dependence, cigarettes, uncomplicated: Secondary | ICD-10-CM | POA: Diagnosis not present

## 2019-03-02 DIAGNOSIS — R102 Pelvic and perineal pain: Secondary | ICD-10-CM | POA: Diagnosis present

## 2019-03-02 DIAGNOSIS — L720 Epidermal cyst: Secondary | ICD-10-CM | POA: Insufficient documentation

## 2019-03-02 DIAGNOSIS — Z79899 Other long term (current) drug therapy: Secondary | ICD-10-CM | POA: Diagnosis not present

## 2019-03-02 DIAGNOSIS — L723 Sebaceous cyst: Secondary | ICD-10-CM

## 2019-03-02 DIAGNOSIS — Z7982 Long term (current) use of aspirin: Secondary | ICD-10-CM | POA: Insufficient documentation

## 2019-03-02 MED ORDER — LIDOCAINE-EPINEPHRINE (PF) 2 %-1:200000 IJ SOLN
INTRAMUSCULAR | Status: AC
  Filled 2019-03-02: qty 10

## 2019-03-02 NOTE — ED Triage Notes (Addendum)
Pt is mute , per daughter in law  2 abscess to vaginal area unknown timeframe

## 2019-03-02 NOTE — ED Provider Notes (Signed)
[place] DEPT [facility] Provider Note: Georgena Spurling, MD, FACEP  CSN: [id number] MRN: [id number] ARRIVAL: 03/02/19 at 2152 ROOM: [place]  Abscess  Level 5 caveat: Expressive aphasia HISTORY OF PRESENT ILLNESS  03/02/19 11:42 PM Melinda Hinton is a 68 y.o. female with a history of expressive aphasia status post stroke.  She is here with a tender, swollen, indurated lesion of her right mons pubis.  It is exquisitely tender but she is unable to quantify this.  There has been no drainage but there are openings with thick, white material exposed.  She has not had a fever.   Past Medical History:  Diagnosis Date  . Aneurysm ([place])   . Expressive aphasia   . Heart attack ([place])   . Stroke [facility])     Past Surgical History:  Procedure Laterality Date  . ANEURYSM COILING      No family history on file.  Social History   Tobacco Use  . Smoking status: Current Every Day Smoker    Packs/day: 0.50    Types: Cigarettes  . Smokeless tobacco: Never Used  Substance Use Topics  . Alcohol use: No  . Drug use: No    Prior to Admission medications   Medication Sig Start Date End Date Taking? Authorizing Provider  amLODipine (NORVASC) 5 MG tablet Take 5 mg by mouth at bedtime. 11/07/16   [provider]  amoxicillin (AMOXIL) 500 MG capsule Take 1 capsule (500 mg total) by mouth 3 (three) times daily. Patient not taking: Reported on 12/02/2016 08/25/16   Macarthur Critchley, MD  aspirin 81 MG tablet Take 81 mg by mouth at bedtime.     [provider]  atorvastatin (LIPITOR) 80 MG tablet Take 80 mg by mouth at bedtime.     [provider]  carvedilol (COREG) 6.25 MG tablet Take 6.25 mg by mouth 2 (two) times daily with a meal.    [provider]  cephALEXin (KEFLEX) 500 MG capsule Take 1 capsule (500 mg total) by mouth 3 (three) times daily. Patient not taking: Reported on 12/02/2016 10/21/15   Virgel Manifold, MD  levothyroxine  (SYNTHROID, LEVOTHROID) 50 MCG tablet Take 50 mcg by mouth daily before breakfast.    [provider]  lisinopril (PRINIVIL,ZESTRIL) 20 MG tablet Take 20 mg by mouth at bedtime.     [provider]  Multiple Vitamins-Minerals (MULTIVITAL) tablet Take 1 tablet by mouth daily.    [provider]  psyllium (TGT PSYLLIUM FIBER) 0.52 g capsule Take 1 capsule by mouth at bedtime.    [provider]  topiramate (TOPAMAX) 50 MG tablet Take 50 mg by mouth 2 (two) times daily.    [provider]    Allergies Patient has no known allergies.   REVIEW OF SYSTEMS  Negative except as noted here or in the History of Present Illness.   PHYSICAL EXAMINATION  Initial Vital Signs Pulse 76, temperature 98.1 F (36.7 C), resp. rate 18, height 5\' 1"  (1.549 m), weight 65.8 kg, last menstrual period 04/24/2012, SpO2 98 %.  Examination General: Well-developed, well-nourished female in no acute distress; appearance consistent with age of record HENT: normocephalic; atraumatic Eyes: pupils equal, round and reactive to light; extraocular muscles grossly intact Neck: supple Heart: regular rate and rhythm Lungs: clear to auscultation bilaterally Abdomen: soft; nondistended; nontender; bowel sounds present Extremities: No deformity; full range of motion; pulses normal Neurologic: Awake, alert; expressive aphasia; motor function intact in all extremities and symmetric; no  facial droop Skin: Warm and dry; tender, erythematous mass of right mons pubis consistent with epidermoid cyst:    Psychiatric: Normal mood and affect   RESULTS  Summary of this visit's results, reviewed and interpreted by myself:   EKG Interpretation  Date/Time:    Ventricular Rate:    PR Interval:    QRS Duration:   QT Interval:    QTC Calculation:   R Axis:     Text Interpretation:        Laboratory Studies: No results found for this or any previous visit (from the past 24  hour(s)). Imaging Studies: No results found.  ED COURSE and MDM  Nursing notes, initial and subsequent vitals signs, including pulse oximetry, reviewed and interpreted by myself.  Vitals:   03/02/19 2206 03/02/19 2208  Pulse:  76  Resp:  18  Temp:  98.1 F (36.7 C)  SpO2:  98%  Weight: 65.8 kg   Height: 5\' 1"  (1.549 m)    Medications  lidocaine-EPINEPHrine (XYLOCAINE W/EPI) 2 %-1:200000 (PF) injection (has no administration in time range)    We will place on an antibiotic for possible infection.  Wound was lightly approximated with plan to allow healing by secondary intention.  PROCEDURES  Procedures INCISION AND DRAINAGE Performed by: Hafiz Irion Consent: Verbal consent obtained. Risks and benefits: risks, benefits and alternatives were discussed Type: Epidermoid cyst  Body area: Right mons pubis  Anesthesia: local infiltration  Incision was made with a scalpel.  Local anesthetic: lidocaine 2% with epinephrine  Anesthetic total: 5 ml  Complexity: complex Sharp dissection to remove cyst wall, no remaining cyst wall seen; excised wall and contents sent for pathology  Drainage: Epidermoid material  Wound closed approximately with a single 4-0 Vicryl suture  Patient tolerance: Patient tolerated the procedure well with no immediate complications.   ED DIAGNOSES     ICD-10-CM   1. Inflamed epidermoid cyst of skin  L72.3        Melitza Metheny, Carlisle Beers, MD 03/03/19 [phone]

## 2019-03-03 MED ORDER — DOXYCYCLINE HYCLATE 100 MG PO TABS
100.0000 mg | ORAL_TABLET | Freq: Once | ORAL | Status: AC
  Administered 2019-03-03: 01:00:00 100 mg via ORAL
  Filled 2019-03-03: qty 1

## 2019-03-03 MED ORDER — DOXYCYCLINE HYCLATE 100 MG PO CAPS: 100.0000 mg | ORAL_CAPSULE | Freq: Two times a day (BID) | ORAL | 0 refills | Status: DC

## 2019-03-07 LAB — SURGICAL PATHOLOGY

## 2021-07-11 ENCOUNTER — Encounter (HOSPITAL_BASED_OUTPATIENT_CLINIC_OR_DEPARTMENT_OTHER): Payer: Self-pay | Admitting: Urology

## 2021-07-11 ENCOUNTER — Other Ambulatory Visit: Payer: Self-pay

## 2021-07-11 ENCOUNTER — Emergency Department (HOSPITAL_BASED_OUTPATIENT_CLINIC_OR_DEPARTMENT_OTHER)
Admission: EM | Admit: 2021-07-11 | Discharge: 2021-07-13 | Disposition: A | Discharge status: Home / Self Care | Payer: Medicare HMO | Attending: Emergency Medicine | Admitting: Emergency Medicine

## 2021-07-11 DIAGNOSIS — T50902A Poisoning by unspecified drugs, medicaments and biological substances, intentional self-harm, initial encounter: Secondary | ICD-10-CM | POA: Diagnosis present

## 2021-07-11 DIAGNOSIS — F32A Depression, unspecified: Secondary | ICD-10-CM | POA: Diagnosis present

## 2021-07-11 DIAGNOSIS — Z79899 Other long term (current) drug therapy: Secondary | ICD-10-CM | POA: Diagnosis not present

## 2021-07-11 DIAGNOSIS — T43212A Poisoning by selective serotonin and norepinephrine reuptake inhibitors, intentional self-harm, initial encounter: Secondary | ICD-10-CM | POA: Insufficient documentation

## 2021-07-11 DIAGNOSIS — Z9151 Personal history of suicidal behavior: Secondary | ICD-10-CM | POA: Diagnosis not present

## 2021-07-11 DIAGNOSIS — U071 COVID-19: Secondary | ICD-10-CM | POA: Insufficient documentation

## 2021-07-11 DIAGNOSIS — R45851 Suicidal ideations: Secondary | ICD-10-CM

## 2021-07-11 DIAGNOSIS — I69320 Aphasia following cerebral infarction: Secondary | ICD-10-CM | POA: Diagnosis not present

## 2021-07-11 DIAGNOSIS — Z7982 Long term (current) use of aspirin: Secondary | ICD-10-CM | POA: Insufficient documentation

## 2021-07-11 LAB — CBC WITH DIFFERENTIAL/PLATELET
Abs Immature Granulocytes: 0.02 10*3/uL (ref 0.00–0.07)
Basophils Absolute: 0.1 10*3/uL (ref 0.0–0.1)
Basophils Relative: 1 %
Eosinophils Absolute: 0.1 10*3/uL (ref 0.0–0.5)
Eosinophils Relative: 2 %
HCT: 44.8 % (ref 36.0–46.0)
Hemoglobin: 15.1 g/dL — ABNORMAL HIGH (ref 12.0–15.0)
Immature Granulocytes: 0 %
Lymphocytes Relative: 19 %
Lymphs Abs: 1.3 10*3/uL (ref 0.7–4.0)
MCH: 31.1 pg (ref 26.0–34.0)
MCHC: 33.7 g/dL (ref 30.0–36.0)
MCV: 92.2 fL (ref 80.0–100.0)
Monocytes Absolute: 0.6 10*3/uL (ref 0.1–1.0)
Monocytes Relative: 9 %
Neutro Abs: 4.4 10*3/uL (ref 1.7–7.7)
Neutrophils Relative %: 69 %
Platelets: 297 10*3/uL (ref 150–400)
RBC: 4.86 MIL/uL (ref 3.87–5.11)
RDW: 13.3 % (ref 11.5–15.5)
WBC: 6.5 10*3/uL (ref 4.0–10.5)
nRBC: 0 % (ref 0.0–0.2)

## 2021-07-11 LAB — COMPREHENSIVE METABOLIC PANEL
ALT: 30 U/L (ref 0–44)
AST: 28 U/L (ref 15–41)
Albumin: 4.1 g/dL (ref 3.5–5.0)
Alkaline Phosphatase: 80 U/L (ref 38–126)
Anion gap: 7 (ref 5–15)
BUN: 24 mg/dL — ABNORMAL HIGH (ref 8–23)
CO2: 29 mmol/L (ref 22–32)
Calcium: 9.2 mg/dL (ref 8.9–10.3)
Chloride: 109 mmol/L (ref 98–111)
Creatinine, Ser: 0.97 mg/dL (ref 0.44–1.00)
GFR, Estimated: >60 mL/min (ref >60)
Glucose, Bld: 111 mg/dL — ABNORMAL HIGH (ref 70–99)
Potassium: 3.8 mmol/L (ref 3.5–5.1)
Sodium: 145 mmol/L (ref 135–145)
Total Bilirubin: 0.4 mg/dL (ref 0.3–1.2)
Total Protein: 7.3 g/dL (ref 6.5–8.1)

## 2021-07-11 LAB — RAPID URINE DRUG SCREEN, HOSP PERFORMED
Amphetamines: NOT DETECTED
Barbiturates: NOT DETECTED
Benzodiazepines: NOT DETECTED
Cocaine: NOT DETECTED
Opiates: NOT DETECTED
Tetrahydrocannabinol: NOT DETECTED

## 2021-07-11 LAB — ETHANOL: Alcohol, Ethyl (B): <10 mg/dL (ref <10)

## 2021-07-11 LAB — RESP PANEL BY RT-PCR (FLU A&B, COVID) ARPGX2
Influenza A by PCR: POSITIVE — AB
Influenza B by PCR: NEGATIVE
SARS Coronavirus 2 by RT PCR: POSITIVE — AB

## 2021-07-11 LAB — ACETAMINOPHEN LEVEL
Acetaminophen (Tylenol), Serum: <10 ug/mL — ABNORMAL LOW (ref 10–30)
Acetaminophen (Tylenol), Serum: <10 ug/mL — ABNORMAL LOW (ref 10–30)

## 2021-07-11 LAB — MAGNESIUM: Magnesium: 2 mg/dL (ref 1.7–2.4)

## 2021-07-11 LAB — SALICYLATE LEVEL: Salicylate Lvl: <7 mg/dL — ABNORMAL LOW (ref 7.0–30.0)

## 2021-07-11 MED ORDER — LEVOTHYROXINE SODIUM 50 MCG PO TABS
50.0000 ug | ORAL_TABLET | Freq: Every day | ORAL | Status: DC
  Administered 2021-07-12 – 2021-07-13 (×2): 50 ug via ORAL
  Filled 2021-07-11: qty 1

## 2021-07-11 MED ORDER — AMLODIPINE BESYLATE 5 MG PO TABS
5.0000 mg | ORAL_TABLET | Freq: Every day | ORAL | Status: DC
  Administered 2021-07-11 – 2021-07-12 (×2): 5 mg via ORAL
  Filled 2021-07-11 (×2): qty 1

## 2021-07-11 MED ORDER — ASPIRIN EC 81 MG PO TBEC
81.0000 mg | DELAYED_RELEASE_TABLET | Freq: Every day | ORAL | Status: DC
  Administered 2021-07-11 – 2021-07-12 (×2): 81 mg via ORAL
  Filled 2021-07-11 (×2): qty 1

## 2021-07-11 MED ORDER — LISINOPRIL 20 MG PO TABS
20.0000 mg | ORAL_TABLET | Freq: Every day | ORAL | Status: DC
  Administered 2021-07-11 – 2021-07-12 (×2): 20 mg via ORAL
  Filled 2021-07-11: qty 2
  Filled 2021-07-11: qty 1

## 2021-07-11 MED ORDER — CARVEDILOL 3.125 MG PO TABS
6.2500 mg | ORAL_TABLET | Freq: Two times a day (BID) | ORAL | Status: DC
  Administered 2021-07-11 – 2021-07-13 (×4): 6.25 mg via ORAL
  Filled 2021-07-11: qty 2
  Filled 2021-07-11: qty 1
  Filled 2021-07-11: qty 2

## 2021-07-11 MED ORDER — TOPIRAMATE 25 MG PO TABS
50.0000 mg | ORAL_TABLET | Freq: Two times a day (BID) | ORAL | Status: DC
Start: 2021-07-11 — End: 2021-07-11

## 2021-07-11 MED ORDER — ATORVASTATIN CALCIUM 40 MG PO TABS
80.0000 mg | ORAL_TABLET | Freq: Every day | ORAL | Status: DC
  Administered 2021-07-11 – 2021-07-12 (×2): 80 mg via ORAL
  Filled 2021-07-11 (×2): qty 2

## 2021-07-11 NOTE — ED Notes (Signed)
[facility] department is here.  ?

## 2021-07-11 NOTE — ED Notes (Signed)
Patient has been transferred to [facility]. Report given to [employer]. Patient son also contacted and advised of patients COVID and flu status.  ?

## 2021-07-11 NOTE — BH Assessment (Signed)
Clinician messaged Melinda Kocher, RN: "Hey. It's Melinda Hinton with TTS. Is the pt able to engage in the assessment, if so the pt will need to be placed in a private room. Also is the pt under IVC? Is the pt medically cleared?"  ? ?Clinician awaiting response.  ? ? ?Melinda Pulling, MS, [facility] ?Triage Specialist ?[phone] ? ? ?

## 2021-07-11 NOTE — BH Assessment (Signed)
Comprehensive Clinical Assessment (CCA) Note ? ?07/11/2021 ?Melinda GowdaSandra Egli ?[id number] ? ?Disposition: Melinda ConnJason Berry, PMHNP recommends geropsychiatric inpatient treatment, CSW to seek placement. Disposition discussed with Dr. Virgina NorfolkAdam Curatolo and Pat KocherNatosha Wheatley, RN.  ? ?Flowsheet Row ED from 07/11/2021 in [facility] EMERGENCY DEPARTMENT  ?C-SSRS RISK CATEGORY Low Risk  ? ?  ? ?The patient demonstrates the following risk factors for suicide: Chronic risk factors for suicide include: psychiatric disorder of Major Depressive Disorder, recurrent, severe and previous suicide attempts Pt has attempted previously . Acute risk factors for suicide include:  pt's health after strokes . Protective factors for this patient include: positive social support. Considering these factors, the overall suicide risk at this point appears to be low. Patient is appropriate for outpatient follow up. ? ?Melinda GowdaSandra Bigley is a 71 year old who presents involuntary and accompanied by Melinda NoelLance and Melinda GallantBrandy Hineman, (son and daughter-in-law) [facility]. Clinician asked the pt, "what brought you to the hospital?" Per daughter-in-law the pt had a gloomy week and took an extra Trazodone. Pt's daughter-in-law reports, extra days of her pills were out but moving forward she will only have the pt's current day of medications out while supervised. Per daughter-in-law, the pt's medications are keep locked up. Pt agreed she took the extra Trazodone to hurt herself. Pt's daughter-in-law reports, the last time pt attempted her husband (he passed away in 2017) was alive. 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 Pt's daughter-in-law and son has lived with her for two years. Pt's daughter-in-law reports, the pt can complete her ADLs. Pt denies, HI, self-injurious behaviors and access to weapons.  ? ?Pt was IVC'd by EDP. Per IVC paperwork:  "Suicidal, attempt at self-harm today with overdose on Trazodone."  ? ?Pt denies, substance use Pt's UDS is pending. Pt's daughter-in-law reports, Dr. Verlee RossettiAndrew Dein (Neurologist) prescribes the pt's Trazodone and something for energy. Per daughter-in-law the pt has tried multiple medications for Depression however they were not effective because her Depression is a side effect from her stokes.  ? ?Pt is non-verbal sitting under covers in hospital bed. Pt's mood, affect was depressed. Pt's judgement was poor. Pt's son and daughter-in-law wants the pt to come home, that is where she feels safe. Clinician discussed the three possible dispositions (discharged with OPT resources, observe/reassess by psychiatry or inpatient treatment) in detail. Clinician expressed to the family the reasoning the pt is in need for an assessment is due to her taking an extra Trazodone, which is looked as a suicide attempt, the pt also agreed she took it to hurt herself.  ? ?Diagnosis: Major Depressive Disorder, recurrent, severe.  ? ?Chief Complaint:  ?Chief Complaint  ?Patient presents with  ? Drug Overdose  ? Suicidal  ? ?Visit Diagnosis:   ? ? ?CCA Screening, Triage and Referral (STR) ? ?Patient Reported Information ?How did you hear about us? Family/Friend ? ?What Is the Reason for Your Visit/Call Today? Per EDP note: "Patient here after intentionally taking extra doses of trazodone. Typically takes 150 mg of trazodone nightly. Took 450 mg about 45 minutes to an hour ago. Family called poison control and they recommended her to come here for observation. Patient with history of stroke, nonverbal. Has suffered with depression ever since this over a decade ago. Multiple attempts of self-harm in the past but not for several years. Per family no specific triggers. Patient overall has no complaints.  Continues to answer yes to feeling suicidal. Family well aware of the chronicity of these feelings. Have tried multiple modalities for treatment  with very little success. They currently live with family member. All of her medications are put in pillboxes. Other self-harm items around the house have been locked away as well." ? ?How Long Has This Been Causing You Problems? No data recorded ?What Do You Feel Would Help You the Most Today? Treatment for Depression or other mood problem ? ? ?Have You Recently Had Any Thoughts About Hurting Yourself? Yes ? ?Are You Planning to Commit Suicide/Harm Yourself At This time? Yes ? ? ?Have you Recently Had Thoughts About Hurting Someone [name]? No ? ?Are You Planning to Harm Someone at This Time? No ? ?Explanation: No data recorded ? ?Have You Used Any Alcohol or Drugs in the Past 24 Hours? No ? ?How Long Ago Did You Use Drugs or Alcohol? No data recorded ?What Did You Use and How Much? No data recorded ? ?Do You Currently Have a Therapist/Psychiatrist? No ? ?Name of Therapist/Psychiatrist: No data recorded ? ?Have You Been Recently Discharged From Any Office Practice or Programs? No data recorded ?Explanation of Discharge From Practice/Program: No data recorded ? ?  ?CCA Screening Triage Referral Assessment ?Type of Contact: Tele-Assessment ? ?Telemedicine Service Delivery: Telemedicine service delivery: This service was provided via telemedicine using a 2-way, interactive audio and video technology ? ?Is this Initial or Reassessment? Initial Assessment ? ?Date Telepsych consult ordered in [facility]:  07/11/21 ? ?Time Telepsych consult ordered in [facility]:  1752 ? ?Location of Assessment: [facility] ? ?Provider Location: [facility] Assessment Services ? ? ?Collateral Involvement: Melinda Hinton and Melinda Hinton, (son and daughter-in-law). ? ? ?Does Patient Have a Automotive engineer Guardian? No data recorded ?Name and Contact of Legal Guardian: No data recorded ?If Minor and Not Living with Parent(s), Who has Custody? No data recorded ?Is CPS involved or ever been involved? No data recorded ?Is APS involved or ever been  involved? No data recorded ? ?Patient Determined To Be At Risk for Harm To Self or Others Based on Review of Patient Reported Information or Presenting Complaint? Yes, for Self-Harm ? ?Method: No data recorded ?Availability of Means: No data recorded ?Intent: No data recorded ?Notification Required: No data recorded ?Additional Information for Danger to Others Potential: No data recorded ?Additional Comments for Danger to Others Potential: No data recorded ?Are There Guns or Other Weapons in Your Home? No data recorded ?Types of Guns/Weapons: No data recorded ?Are These Weapons Safely Secured?                            No data recorded ?Who Could Verify You Are Able To Have These Secured: No data recorded ?Do You Have any Outstanding Charges, Pending Court Dates, Parole/Probation? No data recorded ?Contacted To Inform of Risk of Harm To Self or Others: Family/Significant Other: ? ? ? ?Does Patient Present under Involuntary Commitment? Yes ? ?IVC Papers Initial File Date: 07/11/21 ? ? ?[place] of Residence: [place] ? ? ?Patient Currently Receiving the Following Services: Not Receiving Services ? ? ?Determination of Need: Emergent (2 hours) ? ? ?Options For Referral: Inpatient Hospitalization ? ? ? ? ?CCA Biopsychosocial ?Patient Reported Schizophrenia/Schizoaffective Diagnosis in Past: No data recorded ? ?Strengths: No data recorded ? ?Mental Health Symptoms ?Depression:   ?Sleep (too much or little); Hopelessness; Worthlessness ?  ?Duration of Depressive symptoms:  ?Duration of  Depressive Symptoms: Less than two weeks ?  ?Mania:  No data recorded  ?Anxiety:    ?Worrying; Tension (Per daugther-in-law.) ?  ?Psychosis:   ?-- (UTA) ?  ?Duration of Psychotic symptoms:    ?Trauma:   ?None ?  ?Obsessions:  No data recorded  ?Compulsions:   ?-- (UTA) ?  ?Inattention:   ?-- (UTA) ?  ?Hyperactivity/Impulsivity:   ?None ?  ?Oppositional/Defiant Behaviors:   ?None ?  ?Emotional Irregularity:   ?Recurrent suicidal  behaviors/gestures/threats ?  ?Other Mood/Personality Symptoms:  No data recorded  ? ?Mental Status Exam ?Appearance and self-care  ?Stature:   ?Average ?  ?Weight:   ?Average weight ?  ?Clothing:   ?-- (Pt in hospital gown.

## 2021-07-11 NOTE — ED Provider Notes (Addendum)
Melinda Hinton is a 71 year old female who is nonverbal since a prior stroke who presents today from med [facility] with reports that she tried to harm herself by taking extra doses of her medication.  She was seen and medically cleared at med center.  However in the interim, both her COVID and flu test have become positive.  There is no report that she has had any symptoms.  She is sent here for [facility] psych evaluation. ?Patient is hemodynamically stable ?Psych hold orders were ordered per Dr. Lockie Mola ?If patient remains asymptomatic, would advise consult to infectious disease tomorrow ?  ?Margarita Grizzle, MD ?07/11/21 2249 ? ?  ?Margarita Grizzle, MD ?07/11/21 2250 ? ?

## 2021-07-11 NOTE — ED Triage Notes (Signed)
*  pt nonverbal due to stroke  ?Per daughter pt suicidal x 1 yr ?Took 450mg  Trazodone approx 30 min PTA  ?Normally take 150 mg per night  ?Poison control called and advised ER visit due to possible EKG changes  ? ?Pt sleepy per daughter, no other symptoms noted at this time ? ?Provider at bedside  ?

## 2021-07-11 NOTE — ED Provider Notes (Addendum)
?[facility] EMERGENCY DEPARTMENT ?Provider Note ? ? ?CSN: [id number] ?Arrival date & time: 07/11/21  1739 ? ?  ? ?History ? ?Chief Complaint  ?Patient presents with  ? Drug Overdose  ? Suicidal  ? ? ?Melinda Hinton is a 71 y.o. female. ? ?Patient here after intentionally taking extra doses of trazodone.  Typically takes 150 mg of trazodone nightly.  Took 450 mg about 45 minutes to an hour ago.  Family called poison control and they recommended her to come here for observation.  Patient with history of stroke, nonverbal.  Has suffered with depression ever since this over a decade ago.  Multiple attempts of self-harm in the past but not for several years.  Per family no specific triggers.  Patient overall has no complaints.  Continues to answer yes to feeling suicidal.  Family well aware of the chronicity of these feelings.  Have tried multiple modalities for treatment with very little success.  They currently live with family member.  All of her medications are put in pillboxes.  Other self-harm items around the house have been locked away as well. ? ?The history is provided by the patient and a caregiver.  ?Drug Overdose ?This is a new problem. The current episode started less than 1 hour ago. The problem has not changed since onset.Pertinent negatives include no chest pain, no abdominal pain, no headaches and no shortness of breath. Nothing aggravates the symptoms. Nothing relieves the symptoms. She has tried nothing for the symptoms. The treatment provided no relief.  ? ?  ? ?Home Medications ?Prior to Admission medications   ?Medication Sig Start Date End Date Taking? Authorizing Provider  ?amLODipine (NORVASC) 5 MG tablet Take 5 mg by mouth at bedtime. 11/07/16   [provider]  ?aspirin 81 MG tablet Take 81 mg by mouth at bedtime.     [provider]  ?atorvastatin (LIPITOR) 80 MG tablet Take 80 mg by mouth at bedtime.     [provider]  ?carvedilol (COREG) 6.25 MG tablet  Take 6.25 mg by mouth 2 (two) times daily with a meal.    [provider]  ?doxycycline (VIBRAMYCIN) 100 MG capsule Take 1 capsule (100 mg total) by mouth 2 (two) times daily. One po bid x 7 days 03/03/19   Molpus, John, MD  ?levothyroxine (SYNTHROID, LEVOTHROID) 50 MCG tablet Take 50 mcg by mouth daily before breakfast.    [provider]  ?lisinopril (PRINIVIL,ZESTRIL) 20 MG tablet Take 20 mg by mouth at bedtime.     [provider]  ?Multiple Vitamins-Minerals (MULTIVITAL) tablet Take 1 tablet by mouth daily.    [provider]  ?psyllium (TGT PSYLLIUM FIBER) 0.52 g capsule Take 1 capsule by mouth at bedtime.    [provider]  ?topiramate (TOPAMAX) 50 MG tablet Take 50 mg by mouth 2 (two) times daily.    [provider]  ?   ? ?Allergies    ?Patient has no known allergies.   ? ?Review of Systems   ?Review of Systems  ?Respiratory:  Negative for shortness of breath.   ?Cardiovascular:  Negative for chest pain.  ?Gastrointestinal:  Negative for abdominal pain.  ?Neurological:  Negative for headaches.  ? ?Physical Exam ?Updated Vital Signs ?BP (!) 155/94   Pulse 71   Temp 98 ?F (36.7 ?C) (Oral)   Resp 19   Ht 5\' 1"  (1.549 m)   Wt 65.8 kg   LMP 04/24/2012   SpO2 94%   BMI  27.41 kg/m?  ?Physical Exam ?Vitals and nursing note reviewed.  ?Constitutional:   ?   General: She is not in acute distress. ?   Appearance: She is well-developed.  ?HENT:  ?   Head: Normocephalic and atraumatic.  ?Eyes:  ?   Extraocular Movements: Extraocular movements intact.  ?   Conjunctiva/sclera: Conjunctivae normal.  ?   Pupils: Pupils are equal, round, and reactive to light.  ?Cardiovascular:  ?   Rate and Rhythm: Normal rate and regular rhythm.  ?   Pulses: Normal pulses.  ?   Heart sounds: No murmur heard. ?Pulmonary:  ?   Effort: Pulmonary effort is normal. No respiratory distress.  ?   Breath sounds: Normal breath sounds.  ?Abdominal:  ?   Palpations: Abdomen is soft.  ?    Tenderness: There is no abdominal tenderness.  ?Musculoskeletal:     ?   General: No swelling.  ?   Cervical back: Neck supple.  ?Skin: ?   General: Skin is warm and dry.  ?   Capillary Refill: Capillary refill takes less than 2 seconds.  ?Neurological:  ?   Mental Status: She is alert.  ?Psychiatric:  ?   Comments: Suicidal, depressed  ? ? ?ED Results / Procedures / Treatments   ?Labs ?(all labs ordered are listed, but only abnormal results are displayed) ?Labs Reviewed  ?COMPREHENSIVE METABOLIC PANEL - Abnormal; Notable for the following components:  ?    Result Value  ? Glucose, Bld 111 (*)   ? BUN 24 (*)   ? All other components within normal limits  ?CBC WITH DIFFERENTIAL/PLATELET - Abnormal; Notable for the following components:  ? Hemoglobin 15.1 (*)   ? All other components within normal limits  ?ACETAMINOPHEN LEVEL - Abnormal; Notable for the following components:  ? Acetaminophen (Tylenol), Serum <10 (*)   ? All other components within normal limits  ?SALICYLATE LEVEL - Abnormal; Notable for the following components:  ? Salicylate Lvl [id number] (*)   ? All other components within normal limits  ?RESP PANEL BY RT-PCR (FLU A&B, COVID) ARPGX2  ?ETHANOL  ?MAGNESIUM  ?RAPID URINE DRUG SCREEN, HOSP PERFORMED  ?ACETAMINOPHEN LEVEL  ? ? ?EKG ?EKG Interpretation ? ?Date/Time:  Thursday July 11 2021 17:51:21 EDT ?Ventricular Rate:  68 ?PR Interval:  208 ?QRS Duration: 89 ?QT Interval:  463 ?QTC Calculation: 493 ?R Axis:   39 ?Text Interpretation: Sinus rhythm Nonspecific T abnormalities, lateral leads Borderline prolonged QT interval Confirmed by Lennice Sites [phone]) on 07/11/2021 5:53:20 PM ? ?Radiology ?No results found. ? ?Procedures ?Procedures  ? ? ?Medications Ordered in ED ?Medications  ?amLODipine (NORVASC) tablet 5 mg (has no administration in time range)  ?atorvastatin (LIPITOR) tablet 80 mg (has no administration in time range)  ?aspirin EC tablet 81 mg (has no administration in time range)  ?carvedilol (COREG)  tablet 6.25 mg (6.25 mg Oral Given 07/11/21 1900)  ?levothyroxine (SYNTHROID) tablet 50 mcg (has no administration in time range)  ?lisinopril (ZESTRIL) tablet 20 mg (has no administration in time range)  ? ? ?ED Course/ Medical Decision Making/ A&P ?  ?                        ?Medical Decision Making ?Amount and/or Complexity of Data Reviewed ?Labs: ordered. ? ?Risk ?OTC drugs. ?Prescription drug management. ? ? ?Melinda Hinton is here after suicide attempt with trazodone overdose.  Overall unremarkable vitals.  Blood pressure slightly elevated upon arrival at 217/86.  Patient took 400 mg of trazodone about 45 minutes prior to arrival.  Poison control recommended ED visit.  Patient continues to be suicidal.  History of stroke.  Expressive aphasia.  Intermittently suicidal over the last several years but has been fairly mentally stable for the last year or so per family.  Symptoms started after her stroke over 10 years ago and she is mostly not been suicidal but she has attempted suicide in the past.  Patient lives with family.  She does not have access to weapons.  Her medications are in a pillbox but somehow she was able to get extra doses of her trazodone from her pillbox today.  Overall she appears well.  She has no physical complaints.  EKG shows sinus rhythm.  No ischemic changes.  We will check labs including Tylenol level at 4 hours.  IVC has been filled. ? ?Patient with unremarkable lab work per my review and interpretation.  Medically cleared.  Psychiatry is recommending inpatient treatment.  We will try to get transferred to [facility] long for psych hold.  Home medications have been ordered.  IVC is in place. ? ?Dr. Melina Copa and Dr. Jeanell Sparrow was along ED aware.  Patient to await psych admission at [facility]. ? ?This chart was dictated using voice recognition software.  Despite best efforts to proofread,  errors can occur which can change the documentation meaning.  ? ? ? ? ?Final Clinical Impression(s) / ED  Diagnoses ?Final diagnoses:  ?Suicidal ideation  ? ? ?Rx / DC Orders ?ED Discharge Orders   ? ? None  ? ?  ? ? ?  ?Lennice Sites, DO ?07/11/21 2040 ? ?  ?Lennice Sites, DO ?07/11/21 2054 ? ?

## 2021-07-11 NOTE — ED Notes (Addendum)
Patients belonging sent home with family. Patient will be transferred with glasses and undergarments. Patient is nonverbal ?

## 2021-07-11 NOTE — ED Notes (Signed)
TTS in progress 

## 2021-07-12 DIAGNOSIS — F32A Depression, unspecified: Secondary | ICD-10-CM

## 2021-07-12 DIAGNOSIS — T50902A Poisoning by unspecified drugs, medicaments and biological substances, intentional self-harm, initial encounter: Secondary | ICD-10-CM | POA: Diagnosis present

## 2021-07-12 LAB — URINALYSIS, ROUTINE W REFLEX MICROSCOPIC
Bilirubin Urine: NEGATIVE
Glucose, UA: NEGATIVE mg/dL
Ketones, ur: NEGATIVE mg/dL
Leukocytes,Ua: NEGATIVE
Nitrite: NEGATIVE
Protein, ur: NEGATIVE mg/dL
Specific Gravity, Urine: 1.011 (ref 1.005–1.030)
pH: 5 (ref 5.0–8.0)

## 2021-07-12 MED ORDER — TRAZODONE HCL 50 MG PO TABS
25.0000 mg | ORAL_TABLET | Freq: Every day | ORAL | Status: DC
Start: 2021-07-12 — End: 2021-07-13
  Administered 2021-07-12: 25 mg via ORAL
  Filled 2021-07-12: qty 1

## 2021-07-12 NOTE — ED Notes (Signed)
Unable to find IVC paperwork at this time. Will call [facility] to see if they still have IVC paperwork.  ?

## 2021-07-12 NOTE — Progress Notes (Signed)
Inpatient Behavioral Health ? ?Pt meets inpatient criteria per [name], NP.  Referral was sent to the following facilities;  ? ?Destination ?Service Provider Address Phone Fax  ?[facility]  [address] [place]., [place] [address] [phone]  ?[facility]  [address] [place] [address] [phone]  ?[facility]  [address] [place], [place] [address] [phone]  ?[facility] [address] [place] [address] [phone]  ?[facility]  [address] [place], [place] [address] [phone]  ?[facility]  [address], [place] [address] [phone]  ?[facility]  [address] [place], [place] [address] [phone]  ?[facility]  [address], [place] [address] [phone]  ?[facility]  [address] [place] [place] [address] [phone]  ?[facility]  [address], [place] [address] [phone]  ?[facility]  [address], [place] [address] [phone]  ?[facility]  [address] [place], [place] [address] [phone]  ?[facility]  [address] [place], [place] [address] [phone]  ?[facility]  [address] [place] [address] [phone]  ? ? ?Situation ongoing,  CSW will follow up. ? ? ?Maryjean Ka, MSW, LCSWA ?07/12/2021  @ 12:16 AM ? ?

## 2021-07-12 NOTE — ED Notes (Signed)
EKG performed and given to RN Trinna Post) ?

## 2021-07-12 NOTE — ED Notes (Signed)
Poison control advises repeat EKG and optimize potassium to high normal.  If QTc over 500, let poison control know.  ?

## 2021-07-12 NOTE — Consult Note (Addendum)
Telepsych Consultation  ? ?Reason for Consult:  psych consult ?Referring Physician:  Virgina Norfolk, DO ?Location of Patient:  [id number] ?Location of Provider: Behavioral Health TTS Department ? ?Patient Identification: Melinda Hinton ?MRN:  [id number] ?Principal Diagnosis: Intentional overdose (HCC) ?Diagnosis:  Principal Problem: ?  Intentional overdose (HCC) ?Active Problems: ?  Depression ? ? ?Total Time spent with patient: 20 minutes ? ?Subjective:   ?Melinda Hinton is a 71 y.o. female patient admitted with overdose. ? ?Spero Geralds sister at bedside; assists in assessment due to patient's aphasia. States patient seems like she is feeling "really good". When asked how she felt about most recent incident, patient shook her hand indicating "okay". She nodded yes when asked about depression and thoughts of wanting to harm herself. When asked if she still had plan or intent on harming herself she shook her shoulders and head, "I don't know" but insists she is ready to go home. Sister states daughter in law has removed all medication from patient's reach with plans on personally dispensing patient's medications from now on. Provider explained that due to patient's overdose and continued depression with suicidal ideations the plan was to continue to seek inpatient hospitalization but she would be restarted on medications and continuously monitored in the meantime for further stabilization. Both patient and sister acknowledged an understanding however have expressed wanting patient back home feeling hospitalization will "make her worse".  ? ?HPI:  [name] is a 71 year old female patient with past history of stroke (non-verbal as result), depression, suicide attempt (2017) who initially [facility] Emergency Department with family after intentionally taking overdosing on Trazodone. UDS-, BAL<10. Urinalysis pending. PDMP reviewed, no active prescriptions noted.  ? ?Past Psychiatric History:  depression, suicide attempt (2017)  ? ?Risk to Self:  yes ?Risk to Others:  no ?Prior Inpatient Therapy:  yes ?Prior Outpatient Therapy:  yes ? ?Past Medical History:  ?Past Medical History:  ?Diagnosis Date  ? Aneurysm (HCC)   ? Expressive aphasia   ? Heart attack (HCC)   ? Stroke [facility])   ?  ?Past Surgical History:  ?Procedure Laterality Date  ? ANEURYSM COILING    ? ?Family History: History reviewed. No pertinent family history. ?Family Psychiatric  History: not noted ?Social History:  ?Social History  ? ?Substance and Sexual Activity  ?Alcohol Use No  ?   ?Social History  ? ?Substance and Sexual Activity  ?Drug Use No  ?  ?Social History  ? ?Socioeconomic History  ? Marital status: Widowed  ?  Spouse name: Not on file  ? Number of children: Not on file  ? Years of education: Not on file  ? Highest education level: Not on file  ?Occupational History  ? Not on file  ?Tobacco Use  ? Smoking status: Every Day  ?  Packs/day: 0.50  ?  Types: Cigarettes  ? Smokeless tobacco: Never  ?Substance and Sexual Activity  ? Alcohol use: No  ? Drug use: No  ? Sexual activity: Not on file  ?Other Topics Concern  ? Not on file  ?Social History Narrative  ? Not on file  ? ?Social Determinants of Health  ? ?Financial Resource Strain: Not on file  ?Food Insecurity: Not on file  ?Transportation Needs: Not on file  ?Physical Activity: Not on file  ?Stress: Not on file  ?Social Connections: Not on file  ? ?Additional Social History: ?  ?Allergies:  No Known Allergies ? ?Labs:  ?Results for orders placed or  performed during the hospital encounter of 07/11/21 (from the past 48 hour(s))  ?Comprehensive metabolic panel     Status: Abnormal  ? Collection Time: 07/11/21  5:55 PM  ?Result Value Ref Range  ? Sodium 145 135 - 145 mmol/L  ? Potassium 3.8 3.5 - 5.1 mmol/L  ? Chloride 109 98 - 111 mmol/L  ? CO2 29 22 - 32 mmol/L  ? Glucose, Bld 111 (H) 70 - 99 mg/dL  ?  Comment: Glucose reference range applies only to samples taken after fasting for  at least 8 hours.  ? BUN 24 (H) 8 - 23 mg/dL  ? Creatinine, Ser 0.97 0.44 - 1.00 mg/dL  ? Calcium 9.2 8.9 - 10.3 mg/dL  ? Total Protein 7.3 6.5 - 8.1 g/dL  ? Albumin 4.1 3.5 - 5.0 g/dL  ? AST 28 15 - 41 U/L  ? ALT 30 0 - 44 U/L  ? Alkaline Phosphatase 80 38 - 126 U/L  ? Total Bilirubin 0.4 0.3 - 1.2 mg/dL  ? GFR, Estimated >60 >60 mL/min  ?  Comment: (NOTE) ?Calculated using the CKD-EPI Creatinine Equation (2021) ?  ? Anion gap 7 5 - 15  ?  Comment: Performed at [facility] [place], [address] [place], [place] [address]  ?Ethanol     Status: None  ? Collection Time: 07/11/21  5:55 PM  ?Result Value Ref Range  ? Alcohol, Ethyl (B) <10 <10 mg/dL  ?  Comment:        ?LOWEST DETECTABLE LIMIT FOR ?SERUM ALCOHOL IS 10 mg/dL ?FOR MEDICAL PURPOSES ONLY ?Performed at [facility] [place], [address] [place], [place] [address] ?  ?Urine rapid drug screen (hosp performed)     Status: None  ? Collection Time: 07/11/21  5:55 PM  ?Result Value Ref Range  ? Opiates NONE DETECTED NONE DETECTED  ? Cocaine NONE DETECTED NONE DETECTED  ? Benzodiazepines NONE DETECTED NONE DETECTED  ? Amphetamines NONE DETECTED NONE DETECTED  ? Tetrahydrocannabinol NONE DETECTED NONE DETECTED  ? Barbiturates NONE DETECTED NONE DETECTED  ?  Comment: (NOTE) ?DRUG SCREEN FOR MEDICAL PURPOSES ?ONLY.  IF CONFIRMATION IS NEEDED ?FOR ANY PURPOSE, NOTIFY LAB ?WITHIN 5 DAYS. ? ?LOWEST DETECTABLE LIMITS ?FOR URINE DRUG SCREEN ?Drug Class                     Cutoff (ng/mL) ?Amphetamine and metabolites    1000 ?Barbiturate and metabolites    200 ?Benzodiazepine                 200 ?Tricyclics and metabolites     300 ?Opiates and metabolites        300 ?Cocaine and metabolites        300 ?THC                            50 ?Performed at [facility] [place], [address] [place], [place] [address] ?  ?CBC with Diff     Status: Abnormal  ? Collection Time: 07/11/21  5:55 PM  ?Result Value Ref Range  ? WBC 6.5 4.0 - 10.5 K/uL  ? RBC 4.86 3.87 -  5.11 MIL/uL  ? Hemoglobin 15.1 (H) 12.0 - 15.0 g/dL  ? HCT 44.8 36.0 - 46.0 %  ? MCV 92.2 80.0 - 100.0 fL  ? MCH 31.1 26.0 - 34.0 pg  ? MCHC 33.7 30.0 - 36.0 g/dL  ?  RDW 13.3 11.5 - 15.5 %  ? Platelets 297 150 - 400 K/uL  ? nRBC 0.0 0.0 - 0.2 %  ? Neutrophils Relative % 69 %  ? Neutro Abs 4.4 1.7 - 7.7 K/uL  ? Lymphocytes Relative 19 %  ? Lymphs Abs 1.3 0.7 - 4.0 K/uL  ? Monocytes Relative 9 %  ? Monocytes Absolute 0.6 0.1 - 1.0 K/uL  ? Eosinophils Relative 2 %  ? Eosinophils Absolute 0.1 0.0 - 0.5 K/uL  ? Basophils Relative 1 %  ? Basophils Absolute 0.1 0.0 - 0.1 K/uL  ? Immature Granulocytes 0 %  ? Abs Immature Granulocytes 0.02 0.00 - 0.07 K/uL  ?  Comment: Performed at [facility], [address], [place], [place] [address]  ?Magnesium     Status: None  ? Collection Time: 07/11/21  5:55 PM  ?Result Value Ref Range  ? Magnesium 2.0 1.7 - 2.4 mg/dL  ?  Comment: Performed at [facility], [address], [place], [place] [address]  ?Acetaminophen level     Status: Abnormal  ? Collection Time: 07/11/21  5:55 PM  ?Result Value Ref Range  ? Acetaminophen (Tylenol), Serum <10 (L) 10 - 30 ug/mL  ?  Comment: Performed at [facility], [address], [place], [place] [address]  ?Salicylate level     Status: Abnormal  ? Collection Time: 07/11/21  5:55 PM  ?Result Value Ref Range  ? Salicylate Lvl <7.0 (L) 7.0 - 30.0 mg/dL  ?  Comment: Performed at [facility], [address], [place], [place] [address]  ?Acetaminophen level     Status: Abnormal  ? Collection Time: 07/11/21  8:40 PM  ?Result Value Ref Range  ? Acetaminophen (Tylenol), Serum <10 (L) 10 - 30 ug/mL  ?  Comment: Performed at [facility], [address], [place], [place] [address]  ?Resp Panel by RT-PCR (Flu A&B, Covid) Nasopharyngeal Swab     Status: Abnormal  ? Collection Time: 07/11/21  8:43 PM  ? Specimen: Nasopharyngeal Swab; Nasopharyngeal(NP) swabs in vial transport medium  ?Result Value Ref Range   ? SARS Coronavirus 2 by RT PCR POSITIVE (A) NEGATIVE  ?  Comment: (NOTE) ?SARS-CoV-2 target nucleic acids are DETECTED. ? ?The SARS-CoV-2 RNA is generally detectable in upper respiratory ?specimens

## 2021-07-12 NOTE — Progress Notes (Signed)
Patient has been denied by [facility] due to acuity on the unit currently. Patient meets BH inpatient criteria per [name], PMHNP. Patient has been faxed out to the following facilities:  ? ?[facility]  [address] [place]., [place] [address] [phone]  ?[facility]  [address] [place] [address] [phone]  ?[facility]  [address] [place], [place] [address] [phone]  ?[facility] [place] [address] [phone]  ?[facility]  [address] [place], [place] [address] [phone]  ?[facility]  [address] [place] [address] [phone]  ?[facility]  [address] [place], [place] [address] [phone]  ?[facility]  [address] [place] [address] [phone]  ?[facility]  [address] [place] [address] [phone]  ?[facility]  [address] [place] [address] [phone]  ?[facility]  [address] [place] [address] [phone]  ?[facility]  [address] [place], [place] [address] [phone]  ?[facility]  [address] [place], [place] [address] [phone]  ?[facility]  [address] [place] [address] [phone]  ? ?Damita Dunnings, MSW, LCSW-A  ?10:41 AM 07/12/2021   ?

## 2021-07-12 NOTE — ED Notes (Signed)
Repeat EKG completed and results called to poison control. Case closed by Clayborne Dana, RN. Dr. Nicanor Alcon made aware. ?

## 2021-07-12 NOTE — ED Notes (Signed)
Melinda Hinton declined patient. ?

## 2021-07-12 NOTE — ED Notes (Signed)
Patient in bed resting, calm and cooperative  ?

## 2021-07-13 DIAGNOSIS — T50902A Poisoning by unspecified drugs, medicaments and biological substances, intentional self-harm, initial encounter: Secondary | ICD-10-CM

## 2021-07-13 MED ORDER — LEVOTHYROXINE SODIUM 50 MCG PO TABS
75.0000 ug | ORAL_TABLET | Freq: Every day | ORAL | Status: DC
  Filled 2021-07-13: qty 1

## 2021-07-13 MED ORDER — ATORVASTATIN CALCIUM 80 MG PO TABS: 80.0000 mg | ORAL_TABLET | Freq: Every day | ORAL | 0 refills | Status: AC

## 2021-07-13 MED ORDER — ASPIRIN 81 MG PO TBEC: 81.0000 mg | DELAYED_RELEASE_TABLET | Freq: Every day | ORAL | 11 refills | Status: AC

## 2021-07-13 MED ORDER — LEVOTHYROXINE SODIUM 75 MCG PO TABS: 75.0000 ug | ORAL_TABLET | Freq: Every day | ORAL | 0 refills | Status: AC

## 2021-07-13 MED ORDER — TRAZODONE HCL 50 MG PO TABS
25.0000 mg | ORAL_TABLET | Freq: Every day | ORAL | 0 refills | Status: DC
Start: 2021-07-13 — End: 2024-02-22

## 2021-07-13 MED ORDER — LISINOPRIL 20 MG PO TABS: 20.0000 mg | ORAL_TABLET | Freq: Every day | ORAL | 0 refills | Status: DC

## 2021-07-13 NOTE — Discharge Instructions (Signed)
Please follow up with the instructions provided by the behavioral health team ?

## 2021-07-13 NOTE — Consult Note (Signed)
[facility] ? ?07/13/2021 10:58 AM ?Melinda GowdaSandra Hinton  ?MRN:  [id number] ? ?Method of visit?: Face to Face  ? ?Principal Problem: Intentional overdose (HCC) ?Discharge Diagnoses: Principal Problem: ?  Intentional overdose (HCC) ?Active Problems: ?  Depression ? ? ?Subjective:   ?Melinda GowdaSandra Blaker is a 71 year old who presents involuntary and accompanied by Micah NoelLance and Clinton GallantBrandy Mckinney, (son and daughter-in-law) to [facility] after an OD on her Trazodone.  Patient is aphasic after a stroke, has been depressed and has tried various medications but none has helped her depression.  She answers questions by nodding her head or shrugging her shoulders.  She admitted that she is depressed but want to go back home.  Her son and daughter in Law agrees to take her home knowing that her speech impediment will affect her care anywhere she goes.  Daughter in law works from home and will be managing her Medications.  We discussed fire arm safety and that is already taken care of-locked up.  They are willing to try therapy once a week since she has not responded to Medications.  Patient, this morning denied SI/HI/AVH . She is discharged.  This discharge was discussed with our team especially DR Cinderella covering today.. ? ?Total Time spent with patient: 30 minutes ? ?Past Psychiatric History: Depression, Suicide attempt 2017 ? ?Past Medical History:  ?Past Medical History:  ?Diagnosis Date  ? Aneurysm (HCC)   ? Expressive aphasia   ? Heart attack (HCC)   ? Stroke [facility](HCC)   ?  ?Past Surgical History:  ?Procedure Laterality Date  ? ANEURYSM COILING    ? ?Family History: History reviewed. No pertinent family history. ?Family Psychiatric  History: none  noted ?Social History:  ?Social History  ? ?Substance and Sexual Activity  ?Alcohol Use No  ?   ?Social History  ? ?Substance and Sexual Activity  ?Drug Use No  ?  ?Social History  ? ?Socioeconomic History  ? Marital status: Widowed  ?  Spouse name: Not on file  ? Number of children: Not on  file  ? Years of education: Not on file  ? Highest education level: Not on file  ?Occupational History  ? Not on file  ?Tobacco Use  ? Smoking status: Every Day  ?  Packs/day: 0.50  ?  Types: Cigarettes  ? Smokeless tobacco: Never  ?Substance and Sexual Activity  ? Alcohol use: No  ? Drug use: No  ? Sexual activity: Not on file  ?Other Topics Concern  ? Not on file  ?Social History Narrative  ? Not on file  ? ?Social Determinants of Health  ? ?Financial Resource Strain: Not on file  ?Food Insecurity: Not on file  ?Transportation Needs: Not on file  ?Physical Activity: Not on file  ?Stress: Not on file  ?Social Connections: Not on file  ? ? ?Tobacco Cessation:  N/A, patient does not currently use tobacco products ? ?Current Medications: ?Current Facility-Administered Medications  ?Medication Dose Route Frequency Provider Last Rate Last Admin  ? amLODipine (NORVASC) tablet 5 mg  5 mg Oral QHS Curatolo, Adam, DO   5 mg at 07/12/21 2026  ? aspirin EC tablet 81 mg  81 mg Oral QHS Curatolo, Adam, DO   81 mg at 07/12/21 2025  ? atorvastatin (LIPITOR) tablet 80 mg  80 mg Oral QHS Curatolo, Adam, DO   80 mg at 07/12/21 2026  ? carvedilol (COREG) tablet 6.25 mg  6.25 mg Oral BID WC Virgina Norfolkuratolo, Adam, DO  6.25 mg at 07/13/21 1003  ? [START ON 07/14/2021] levothyroxine (SYNTHROID) tablet 75 mcg  75 mcg Oral QAC breakfast Linwood Dibbles, MD      ? lisinopril (ZESTRIL) tablet 20 mg  20 mg Oral QHS Curatolo, Adam, DO   20 mg at 07/12/21 2025  ? traZODone (DESYREL) tablet 25 mg  25 mg Oral QHS Leevy-Johnson, Brooke A, NP   25 mg at 07/12/21 2026  ? ?Current Outpatient Medications  ?Medication Sig Dispense Refill  ? aspirin 81 MG tablet Take 81 mg by mouth daily after supper.    ? atorvastatin (LIPITOR) 80 MG tablet Take 80 mg by mouth daily after supper.    ? cetirizine (ZYRTEC) 10 MG tablet Take 10 mg by mouth daily after supper.    ? fluticasone (FLONASE) 50 MCG/ACT nasal spray Place 1 spray into both nostrils at bedtime.    ? ibuprofen  (ADVIL) 200 MG tablet Take 400 mg by mouth every 6 (six) hours as needed for headache (pain).    ? levothyroxine (SYNTHROID) 75 MCG tablet Take 75 mcg by mouth daily before breakfast.    ? lisinopril (PRINIVIL,ZESTRIL) 20 MG tablet Take 20 mg by mouth daily after supper.    ? modafinil (PROVIGIL) 200 MG tablet Take 100 mg by mouth daily after breakfast.    ? Multiple Vitamins-Minerals (ADULT ONE DAILY GUMMIES) CHEW Chew 1 tablet by mouth daily after breakfast. Centrum    ? traZODone (DESYREL) 50 MG tablet Take 150 mg by mouth at bedtime.    ? amLODipine (NORVASC) 5 MG tablet Take 5 mg by mouth at bedtime. (Patient not taking: Reported on 07/13/2021)    ? carvedilol (COREG) 6.25 MG tablet Take 6.25 mg by mouth 2 (two) times daily with a meal. (Patient not taking: Reported on 07/13/2021)    ? levothyroxine (SYNTHROID, LEVOTHROID) 50 MCG tablet Take 50 mcg by mouth daily before breakfast. (Patient not taking: Reported on 07/13/2021)    ? topiramate (TOPAMAX) 50 MG tablet Take 50 mg by mouth 2 (two) times daily. (Patient not taking: Reported on 07/13/2021)    ? ?PTA Medications: ?(Not in a hospital admission) ? ? ?Musculoskeletal: ?Strength & Muscle Tone:  lying down in bed ?Gait & Station:  see above ?Patient leans:  see above ? ?Psychiatric Specialty Exam: ? ?Presentation  ?General Appearance: Appropriate for Environment ? ?Eye Contact:Good ? ?Speech:Other (comment) (aphasic) ? ?Speech Volume:Other (comment) (aphasic) ? ?Handedness:No data recorded ? ?Mood and Affect  ?Mood:Dysphoric ? ?Affect:Other (comment) (withdrawn) ? ? ?Thought Process  ?Thought Processes:Coherent; Linear ? ?Descriptions of Associations:Intact ? ?Orientation:Partial ? ?Thought Content:Logical ? ?History of Schizophrenia/Schizoaffective disorder:No data recorded ?Duration of Psychotic Symptoms:No data recorded ?Hallucinations:Hallucinations: None ? ?Ideas of Reference:None ? ?Suicidal Thoughts:Suicidal Thoughts: Yes, Passive ? ?Homicidal  Thoughts:Homicidal Thoughts: No ? ? ?Sensorium  ?Memory:Immediate Fair; Recent Fair; Remote Fair ? ?Judgment:Other (comment) (impulsive) ? ?Insight:Present ? ? ?Executive Functions  ?Concentration:Fair ? ?Attention Span:Fair ? ?Recall:Fair ? ?Fund of Knowledge:Fair ? ?Language:Fair ? ? ?Psychomotor Activity  ?Psychomotor Activity:Psychomotor Activity: Normal ? ? ?Assets  ?Assets:Desire for Improvement; Financial Resources/Insurance; Housing; Physical Health; Resilience; Social Support; Vocational/Educational ? ? ?Sleep  ?Sleep:Sleep: Good ? ? ? ?Physical Exam: ?Physical Exam ?Vitals and nursing note reviewed.  ?Constitutional:   ?   Appearance: Normal appearance. She is normal weight.  ?HENT:  ?   Head: Normocephalic and atraumatic.  ?   Nose: Nose normal.  ?Cardiovascular:  ?   Rate and Rhythm: Bradycardia present.  ?Pulmonary:  ?  Effort: Pulmonary effort is normal.  ?Musculoskeletal:     ?   General: Normal range of motion.  ?   Cervical back: Normal range of motion.  ?Skin: ?   General: Skin is warm and dry.  ?Neurological:  ?   General: No focal deficit present.  ?   Mental Status: She is alert and oriented to person, place, and time.  ? ?ROS-Unable to assess, patient is aphasic. ?Blood pressure (!) 154/56, pulse (!) 55, temperature (!) 97.5 ?F (36.4 ?C), temperature source Oral, resp. rate 18, height 5\' 1"  (1.549 m), weight 65.8 kg, last menstrual period 04/24/2012, SpO2 95 %. Body mass index is 27.41 kg/m?. ? ? ?Demographic Factors:  ?Age 71 or older, Divorced or widowed, Caucasian, and per son firearm in the house is locked up ? ?Loss Factors: ?Decline in physical health and Aphasic after stroke. ? ?Historical Factors: ?Prior suicide attempts and Impulsivity ? ?Risk Reduction Factors:   ?Living with another person, especially a relative and lives with son and daughter in-law ? ?Continued Clinical Symptoms:  ?Depression:   Impulsivity ?Insomnia ? ?Cognitive Features That Contribute To Risk:  ?None    ? ?Suicide Risk:  ?Moderate:  Frequent suicidal ideation with limited intensity, and duration, some specificity in terms of plans, no associated intent, good self-control, limited dysphoria/symptomatology, some risk factors

## 2021-07-13 NOTE — ED Provider Notes (Addendum)
Patient waiting for [facility] psych placement.  Notified by pharmacy this morning that her Synthroid supposed to be 75 mcg.  It also appears that she may longer be taking her Norvasc for her Coreg however blood pressure still somewhat elevated this morning at 154/56.  We will maintain on same regimen for now ?  ?Dorie Rank, MD ?07/13/21 [phone] ? ?Pt was seen  by psychiatry and cleared for discharge this am. ?  ?Dorie Rank, MD ?07/13/21 1159 ? ?

## 2022-07-18 ENCOUNTER — Encounter (HOSPITAL_BASED_OUTPATIENT_CLINIC_OR_DEPARTMENT_OTHER): Payer: Self-pay | Admitting: Emergency Medicine

## 2022-07-18 ENCOUNTER — Other Ambulatory Visit: Payer: Self-pay

## 2022-07-18 ENCOUNTER — Emergency Department (HOSPITAL_BASED_OUTPATIENT_CLINIC_OR_DEPARTMENT_OTHER)
Admission: EM | Admit: 2022-07-18 | Discharge: 2022-07-18 | Disposition: A | Discharge status: Home / Self Care | Payer: Medicare HMO | Attending: Emergency Medicine | Admitting: Emergency Medicine

## 2022-07-18 DIAGNOSIS — R1030 Lower abdominal pain, unspecified: Secondary | ICD-10-CM | POA: Diagnosis not present

## 2022-07-18 DIAGNOSIS — I10 Essential (primary) hypertension: Secondary | ICD-10-CM | POA: Diagnosis not present

## 2022-07-18 DIAGNOSIS — R300 Dysuria: Secondary | ICD-10-CM | POA: Diagnosis not present

## 2022-07-18 DIAGNOSIS — Z7982 Long term (current) use of aspirin: Secondary | ICD-10-CM | POA: Insufficient documentation

## 2022-07-18 DIAGNOSIS — L299 Pruritus, unspecified: Secondary | ICD-10-CM | POA: Insufficient documentation

## 2022-07-18 DIAGNOSIS — Z79899 Other long term (current) drug therapy: Secondary | ICD-10-CM | POA: Insufficient documentation

## 2022-07-18 DIAGNOSIS — R339 Retention of urine, unspecified: Secondary | ICD-10-CM | POA: Diagnosis present

## 2022-07-18 LAB — BASIC METABOLIC PANEL
Anion gap: 8 (ref 5–15)
BUN: 18 mg/dL (ref 8–23)
CO2: 28 mmol/L (ref 22–32)
Calcium: 9.1 mg/dL (ref 8.9–10.3)
Chloride: 103 mmol/L (ref 98–111)
Creatinine, Ser: 0.83 mg/dL (ref 0.44–1.00)
GFR, Estimated: >60 mL/min (ref >60)
Glucose, Bld: 148 mg/dL — ABNORMAL HIGH (ref 70–99)
Potassium: 3.8 mmol/L (ref 3.5–5.1)
Sodium: 139 mmol/L (ref 135–145)

## 2022-07-18 LAB — CBC
HCT: 45.6 % (ref 36.0–46.0)
Hemoglobin: 15 g/dL (ref 12.0–15.0)
MCH: 30.1 pg (ref 26.0–34.0)
MCHC: 32.9 g/dL (ref 30.0–36.0)
MCV: 91.4 fL (ref 80.0–100.0)
Platelets: 311 10*3/uL (ref 150–400)
RBC: 4.99 MIL/uL (ref 3.87–5.11)
RDW: 13.2 % (ref 11.5–15.5)
WBC: 7.6 10*3/uL (ref 4.0–10.5)
nRBC: 0 % (ref 0.0–0.2)

## 2022-07-18 LAB — URINALYSIS, ROUTINE W REFLEX MICROSCOPIC
Bilirubin Urine: NEGATIVE
Glucose, UA: NEGATIVE mg/dL
Ketones, ur: NEGATIVE mg/dL
Leukocytes,Ua: NEGATIVE
Nitrite: NEGATIVE
Protein, ur: NEGATIVE mg/dL
Specific Gravity, Urine: 1.025 (ref 1.005–1.030)
pH: 5.5 (ref 5.0–8.0)

## 2022-07-18 LAB — URINALYSIS, MICROSCOPIC (REFLEX)

## 2022-07-18 MED ORDER — FLUCONAZOLE 150 MG PO TABS: 150.0000 mg | ORAL_TABLET | Freq: Every day | ORAL | 0 refills | Status: DC

## 2022-07-18 NOTE — Discharge Instructions (Signed)
Take the Diflucan to see if that helps with some of the irritation symptoms you have experienced.  Follow-up with an OB/GYN doctor to be rechecked.  Return to the ER for fevers vomiting worsening symptoms

## 2022-07-18 NOTE — ED Notes (Signed)
0 mls in bladder 

## 2022-07-18 NOTE — ED Triage Notes (Signed)
Pt noon verbal due to stoke, daughter in law helping in triage . Pt reports urinary retention , last time she voided last night , and it was not much she reported . Itching and burning . Hx UTIs . Alert and oriented .

## 2022-07-18 NOTE — ED Provider Notes (Signed)
[place] [facility] Provider Note   CSN: [id number] Arrival date & time: 07/18/22  1028     History  Chief Complaint  Patient presents with   Urinary Retention    Melinda Hinton is a 72 y.o. female.  HPI   Patient presents ED for evaluation of decreased urination.  Patient has history of expressive aphasia, myocardial infarction, prior stroke.  Patient is nonverbal prior stroke.  Patient told her daughter this morning that she had not been able to urinate since last night.  She has also had some itching and burning.  Patient does have history of UTIs.  She has not been complaining of any abdominal pain.  Home Medications Prior to Admission medications   Medication Sig Start Date End Date Taking? Authorizing Provider  fluconazole (DIFLUCAN) 150 MG tablet Take 1 tablet (150 mg total) by mouth daily. 07/18/22  Yes Linwood Dibbles, MD  aspirin EC 81 MG EC tablet Take 1 tablet (81 mg total) by mouth at bedtime. Swallow whole. 07/13/21   Earney Navy, NP  atorvastatin (LIPITOR) 80 MG tablet Take 1 tablet (80 mg total) by mouth at bedtime. 07/13/21 08/12/21  Earney Navy, NP  cetirizine (ZYRTEC) 10 MG tablet Take 10 mg by mouth daily after supper.    [provider]  fluticasone (FLONASE) 50 MCG/ACT nasal spray Place 1 spray into both nostrils at bedtime.    [provider]  levothyroxine (SYNTHROID) 75 MCG tablet Take 1 tablet (75 mcg total) by mouth daily before breakfast. 07/14/21 08/13/21  Earney Navy, NP  lisinopril (ZESTRIL) 20 MG tablet Take 1 tablet (20 mg total) by mouth at bedtime. 07/13/21 08/12/21  Earney Navy, NP  Multiple Vitamins-Minerals (ADULT ONE DAILY GUMMIES) CHEW Chew 1 tablet by mouth daily after breakfast. Centrum    [provider]  traZODone (DESYREL) 50 MG tablet Take 0.5 tablets (25 mg total) by mouth at bedtime. 07/13/21 08/12/21  Earney Navy, NP      Allergies    Patient has no  known allergies.    Review of Systems   Review of Systems  Physical Exam Updated Vital Signs BP (!) 188/85   Pulse 71   Temp 97.7 F (36.5 C)   Resp 18   Wt 57.1 kg   LMP 04/24/2012   SpO2 97%   BMI 23.79 kg/m  Physical Exam Vitals and nursing note reviewed.  Constitutional:      General: She is not in acute distress.    Appearance: She is well-developed.  HENT:     Head: Normocephalic and atraumatic.     Right Ear: External ear normal.     Left Ear: External ear normal.  Eyes:     General: No scleral icterus.       Right eye: No discharge.        Left eye: No discharge.     Conjunctiva/sclera: Conjunctivae normal.  Neck:     Trachea: No tracheal deviation.  Cardiovascular:     Rate and Rhythm: Normal rate.  Pulmonary:     Effort: Pulmonary effort is normal. No respiratory distress.     Breath sounds: No stridor.  Abdominal:     General: There is no distension.     Tenderness: There is abdominal tenderness.     Comments: Mild tenderness palpation suprapubic region, no rebound or guarding  Genitourinary:    Labia:        Right: No rash or lesion.  Left: No rash or lesion.      Urethra: No urethral swelling or urethral lesion.  Musculoskeletal:        General: No swelling or deformity.     Cervical back: Neck supple.  Skin:    General: Skin is warm and dry.     Findings: No rash.  Neurological:     Mental Status: She is alert. Mental status is at baseline.     Cranial Nerves: No dysarthria or facial asymmetry.     Motor: No seizure activity.     ED Results / Procedures / Treatments   Labs (all labs ordered are listed, but only abnormal results are displayed) Labs Reviewed  URINALYSIS, ROUTINE W REFLEX MICROSCOPIC - Abnormal; Notable for the following components:      Result Value   Hgb urine dipstick TRACE (*)    All other components within normal limits  BASIC METABOLIC PANEL - Abnormal; Notable for the following components:   Glucose, Bld 148  (*)    All other components within normal limits  URINALYSIS, MICROSCOPIC (REFLEX) - Abnormal; Notable for the following components:   Bacteria, UA FEW (*)    All other components within normal limits  CBC    EKG None  Radiology No results found.  Procedures Procedures    Medications Ordered in ED Medications - No data to display  ED Course/ Medical Decision Making/ A&P Clinical Course as of 07/18/22 1300  Fri Jul 18, 2022  1146 CBC and metabolic panel normal. [JK]  1147 Bladder scan showed 0 MALS [JK]  1245 Urinalysis without signs of infection [JK]    Clinical Course User Index [JK] Linwood Dibbles, MD                             Medical Decision Making Differential diagnosis includes but not limited to acute renal failure, urinary retention, urinary tract infection  Problems Addressed: Dysuria: acute illness or injury  Amount and/or Complexity of Data Reviewed Labs: ordered.  Risk Prescription drug management.   Patient presented to ED for evaluation of decreased urination.  Patient's bladder scan did not show any evidence of urinary retention.  A catheterized specimen was also obtained and there is only 100 cc of urine in her bladder.  Urinalysis does not suggest infection.  Patient does not have an evidence of acute renal failure.  Kidney function is normal.  Her abdominal exam is benign.  Doubt colitis diverticulitis.  No evidence of vaginal rash to account for her symptoms.  Considered the possibility of yeast infection causing some of the itching.  No clear source of that on exam but will try empiric to see if there is some improvement.  Possibilities include vaginal dryness and irritation.  She can also have interstitial cystitis.  Recommend outpatient follow-up with gynecology for further evaluation  She is also noted to be hypertensive.  Daughter states this is often an issue for her.  Patient had not taken her medications today.  Recommend outpatient follow-up  with PCP to have that rechecked       Final Clinical Impression(s) / ED Diagnoses Final diagnoses:  Dysuria  Hypertension, unspecified type    Rx / DC Orders ED Discharge Orders          Ordered    fluconazole (DIFLUCAN) 150 MG tablet  Daily        07/18/22 1256  Linwood Dibbles, MD 07/18/22 1300

## 2022-07-18 NOTE — ED Notes (Signed)
Dr Lynelle Doctor in to exam of genitalia  for her itching with stand by nurse , daughter in room

## 2023-06-10 ENCOUNTER — Encounter (HOSPITAL_BASED_OUTPATIENT_CLINIC_OR_DEPARTMENT_OTHER): Payer: Self-pay | Admitting: Emergency Medicine

## 2023-06-10 ENCOUNTER — Other Ambulatory Visit: Payer: Self-pay

## 2023-06-10 ENCOUNTER — Emergency Department (HOSPITAL_BASED_OUTPATIENT_CLINIC_OR_DEPARTMENT_OTHER)
Admission: EM | Admit: 2023-06-10 | Discharge: 2023-06-10 | Disposition: A | Discharge status: Home / Self Care | Attending: Emergency Medicine | Admitting: Emergency Medicine

## 2023-06-10 DIAGNOSIS — R4701 Aphasia: Secondary | ICD-10-CM | POA: Diagnosis present

## 2023-06-10 DIAGNOSIS — F17210 Nicotine dependence, cigarettes, uncomplicated: Secondary | ICD-10-CM | POA: Diagnosis not present

## 2023-06-10 DIAGNOSIS — Z8673 Personal history of transient ischemic attack (TIA), and cerebral infarction without residual deficits: Secondary | ICD-10-CM | POA: Insufficient documentation

## 2023-06-10 DIAGNOSIS — R29810 Facial weakness: Secondary | ICD-10-CM

## 2023-06-10 DIAGNOSIS — R0989 Other specified symptoms and signs involving the circulatory and respiratory systems: Secondary | ICD-10-CM | POA: Diagnosis not present

## 2023-06-10 NOTE — ED Provider Notes (Signed)
 Shared PA visit.  Patient here for runny nose.  She had a brief episode over the weekend about 4 days ago 5 days ago on Saturday where she was having runny nose some drooling from the lower lip that resolved that day.  She has been dealing with chronic runny nose for some time taking Sudafed Zyrtec Flonase Benadryl with some relief.  She has had actually improved symptoms especially today she does not have any active runny nose or drooling.  She has had a history of stroke right-sided facial droop at baseline chronic aphasia does not speak does not hear well.  But she has been at her neurologic baseline since Saturday per family.  They had no concern whatsoever for stroke when they tried to arrange for a follow-up with her primary care doctor to discuss the runny nose and told him about the story on Saturday they were told to come to the ED for stroke evaluation.  Her exam is unremarkable.  She is at her baseline.  Family agrees.  We discussed about whether what happened Saturday 5 days ago was stroke related or is just some sort of rhinitis from her smoking or some other allergen process as this has been going on for some time.  Overall shared decision was made to hold off on any workup including any stroke workup.  She has normal vitals.  No fever.  She is at her baseline.  She can follow with her neurologist and her primary care doctor but she is pretty optimized from a stroke standpoint at this time.  She not having any headache or any major symptoms at this time.  Continue her antihistamines and patient discharged.  This chart was dictated using voice recognition software.  Despite best efforts to proofread,  errors can occur which can change the documentation meaning.    Virgina Norfolk, DO 06/10/23 [phone]

## 2023-06-10 NOTE — ED Provider Notes (Signed)
 [place] [facility] Provider Note   CSN: [id number] Arrival date & time: 06/10/23  1719     History  Chief Complaint  Patient presents with   Facial Droop    Melinda Hinton is a 73 y.o. female with a history of chronic rhinitis, chronic mouth breathing, chronic drooping lower lip, history of stroke with residual right sided field cut of vision, expressive aphasia who smokes 7 cigarettes daily.  She is medically optimized.  Her granddaughter who is her primary caretaker brought her in after they called to make her an appointment for her runny nose she is already taking Flonase Zyrtec Benadryl.  She continues to have very runny nose which is bothersome to the patient.  Her husband mentioned to them that she had drool on Saturday when they were out to a restaurant.  Patient may have dozed off but had drool that was streaming from her mouth down to her lap.  She noticed it lifted her head up and then wiped her mouth with a napkin.  She had no other neurologic deficits, no facial droop that is new, no weakness on 1 side of the body's and is at her baseline per family and has been since then.  Her granddaughter states "I was not even worried about this but her primary care doctor made it sound like she had some kind of a medical emergency and thought she was having another stroke so told us to come here."  Granddaughter states "I do not think she had a stroke."  HPI     Home Medications Prior to Admission medications   Medication Sig Start Date End Date Taking? Authorizing Provider  aspirin EC 81 MG EC tablet Take 1 tablet (81 mg total) by mouth at bedtime. Swallow whole. 07/13/21   Earney Navy, NP  atorvastatin (LIPITOR) 80 MG tablet Take 1 tablet (80 mg total) by mouth at bedtime. 07/13/21 08/12/21  Earney Navy, NP  cetirizine (ZYRTEC) 10 MG tablet Take 10 mg by mouth daily after supper.    [provider]  fluconazole (DIFLUCAN) 150 MG  tablet Take 1 tablet (150 mg total) by mouth daily. 07/18/22   Linwood Dibbles, MD  fluticasone (FLONASE) 50 MCG/ACT nasal spray Place 1 spray into both nostrils at bedtime.    [provider]  levothyroxine (SYNTHROID) 75 MCG tablet Take 1 tablet (75 mcg total) by mouth daily before breakfast. 07/14/21 08/13/21  Earney Navy, NP  lisinopril (ZESTRIL) 20 MG tablet Take 1 tablet (20 mg total) by mouth at bedtime. 07/13/21 08/12/21  Earney Navy, NP  Multiple Vitamins-Minerals (ADULT ONE DAILY GUMMIES) CHEW Chew 1 tablet by mouth daily after breakfast. Centrum    [provider]  traZODone (DESYREL) 50 MG tablet Take 0.5 tablets (25 mg total) by mouth at bedtime. 07/13/21 08/12/21  Earney Navy, NP      Allergies    Patient has no known allergies.    Review of Systems   Review of Systems  Physical Exam Updated Vital Signs BP 129/66 (BP Location: Left Arm)   Pulse 63   Temp 98 F (36.7 C)   Resp 18   LMP 04/24/2012   SpO2 95%  Physical Exam Vitals and nursing note reviewed.  Constitutional:      General: She is not in acute distress.    Appearance: She is well-developed. She is not diaphoretic.  HENT:     Head: Normocephalic and atraumatic.  Right Ear: External ear normal.     Left Ear: External ear normal.     Nose: Nose normal.     Mouth/Throat:     Mouth: Mucous membranes are moist.  Eyes:     General: No scleral icterus.    Conjunctiva/sclera: Conjunctivae normal.  Cardiovascular:     Rate and Rhythm: Normal rate and regular rhythm.     Heart sounds: Normal heart sounds. No murmur heard.    No friction rub. No gallop.  Pulmonary:     Effort: Pulmonary effort is normal. No respiratory distress.     Breath sounds: Normal breath sounds.  Abdominal:     General: Bowel sounds are normal. There is no distension.     Palpations: Abdomen is soft. There is no mass.     Tenderness: There is no abdominal tenderness. There is no guarding.   Musculoskeletal:     Cervical back: Normal range of motion.  Skin:    General: Skin is warm and dry.  Neurological:     General: No focal deficit present.     Mental Status: She is alert and oriented to person, place, and time.     Comments: Patient has equal and symmetric facial muscle movement without any signs of facial weakness or asymmetry.  Her bottom lip hangs loose and she breathes through her mouth. Equal bilateral upper extremity strength, normal finger-to-nose bilaterally.  No pronator drift  Psychiatric:        Behavior: Behavior normal.     ED Results / Procedures / Treatments   Labs (all labs ordered are listed, but only abnormal results are displayed) Labs Reviewed - No data to display  EKG None  Radiology No results found.  Procedures Procedures    Medications Ordered in ED Medications - No data to display  ED Course/ Medical Decision Making/ A&P                                 Medical Decision Making  73 year old female sent in for evaluation of drooling.  In shared decision making with the family and primary caretaker do not feel that she needs any evaluation at this time.  She is at baseline with her his story of chronic aphasia but is able to follow directions nod yes or no.  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  Their primary concern is her continued runny nose which she feels is very uncomfortable.  In shared decision making we will not continue with any further workup.  We have discussed reasons to seek immediate medical care for potential evaluation of stroke.  I doubt TIA.  She is well optimized on her medical therapy at this time.  She will follow-up in the outpatient setting with neurology or PCP and appears appropriate for discharge without further evaluation at this time.  Patient seen in shared visit with attending  physician. Who agrees with assessment, work up , treatment, and plan for discharge         Final Clinical Impression(s) / ED Diagnoses Final diagnoses:  None    Rx / DC Orders ED Discharge Orders     None         Arthor Captain, PA-C 06/10/23 1800    Virgina Norfolk, DO 06/10/23 1812

## 2023-06-10 NOTE — ED Triage Notes (Signed)
 Family noticed facial droop and increased drooling on Saturday. Hx of aneurysm and previous strokes. Patient mute. Family states no other symptoms noticed

## 2023-06-10 NOTE — Discharge Instructions (Signed)
 Get help right away if: You have difficulty breathing. This symptom may be an emergency. Get help right away. Call 911. Do not wait to see if the symptoms will go away. Do not drive yourself to the hospital.

## 2023-09-15 ENCOUNTER — Other Ambulatory Visit: Payer: Self-pay

## 2023-09-15 ENCOUNTER — Emergency Department (HOSPITAL_BASED_OUTPATIENT_CLINIC_OR_DEPARTMENT_OTHER)
Admission: EM | Admit: 2023-09-15 | Discharge: 2023-09-15 | Disposition: A | Discharge status: Home / Self Care | Attending: Emergency Medicine | Admitting: Emergency Medicine

## 2023-09-15 ENCOUNTER — Encounter (HOSPITAL_BASED_OUTPATIENT_CLINIC_OR_DEPARTMENT_OTHER): Payer: Self-pay | Admitting: Emergency Medicine

## 2023-09-15 ENCOUNTER — Emergency Department (HOSPITAL_BASED_OUTPATIENT_CLINIC_OR_DEPARTMENT_OTHER)

## 2023-09-15 DIAGNOSIS — R0602 Shortness of breath: Secondary | ICD-10-CM | POA: Insufficient documentation

## 2023-09-15 DIAGNOSIS — R0600 Dyspnea, unspecified: Secondary | ICD-10-CM

## 2023-09-15 DIAGNOSIS — R079 Chest pain, unspecified: Secondary | ICD-10-CM | POA: Diagnosis not present

## 2023-09-15 DIAGNOSIS — Z7982 Long term (current) use of aspirin: Secondary | ICD-10-CM | POA: Insufficient documentation

## 2023-09-15 DIAGNOSIS — R001 Bradycardia, unspecified: Secondary | ICD-10-CM | POA: Diagnosis not present

## 2023-09-15 LAB — CBC
HCT: 44.8 % (ref 36.0–46.0)
Hemoglobin: 14.9 g/dL (ref 12.0–15.0)
MCH: 30.7 pg (ref 26.0–34.0)
MCHC: 33.3 g/dL (ref 30.0–36.0)
MCV: 92.2 fL (ref 80.0–100.0)
Platelets: 345 10*3/uL (ref 150–400)
RBC: 4.86 MIL/uL (ref 3.87–5.11)
RDW: 13.1 % (ref 11.5–15.5)
WBC: 7.9 10*3/uL (ref 4.0–10.5)
nRBC: 0 % (ref 0.0–0.2)

## 2023-09-15 LAB — BASIC METABOLIC PANEL WITH GFR
Anion gap: 12 (ref 5–15)
BUN: 18 mg/dL (ref 8–23)
CO2: 26 mmol/L (ref 22–32)
Calcium: 9.8 mg/dL (ref 8.9–10.3)
Chloride: 103 mmol/L (ref 98–111)
Creatinine, Ser: 0.88 mg/dL (ref 0.44–1.00)
GFR, Estimated: >60 mL/min (ref >60)
Glucose, Bld: 133 mg/dL — ABNORMAL HIGH (ref 70–99)
Potassium: 4.2 mmol/L (ref 3.5–5.1)
Sodium: 142 mmol/L (ref 135–145)

## 2023-09-15 LAB — TROPONIN T, HIGH SENSITIVITY
Troponin T High Sensitivity: <15 ng/L (ref <19)
Troponin T High Sensitivity: <15 ng/L (ref <19)

## 2023-09-15 NOTE — ED Notes (Signed)
 D/c paperwork reviewed with pt and pts family at bedside, including follow up care.  All questions and/or concerns addressed at time of d/c.  No further needs expressed. . Pt verbalized understanding, Wheeled by ED staff to ED exit, NAD.

## 2023-09-15 NOTE — ED Triage Notes (Addendum)
 Pt is non verbal due to Hx aneurysm  , daughter in law is the historian , reports patient was at regular check up at PCP , when pt had shortness of breath , bradycardia .  Adds had chest pain at the pcp . Pt was holding her chest when she had the shortness of breath episode .

## 2023-09-15 NOTE — ED Provider Notes (Signed)
 [place] [facility] Provider Note   CSN: [id number] Arrival date & time: 09/15/23  4098     History  Chief Complaint  Patient presents with   Shortness of Breath   Chest Pain    Melinda Hinton is a 73 y.o. female.  With a history of stroke with residual aphasia and MI who presents to the ED for shortness of breath.  Patient was seen earlier today in her PCP office.  At that time she began to have rapid breathing and endorsing chest pain.  Was noted to be bradycardic in the 50s at PCP office.  She was sent here for further evaluation.  She still had some shortness of breath upon initial arrival here.  No active chest pain.  No nausea vomiting fevers chills or recent illness.   Shortness of Breath Associated symptoms: chest pain   Chest Pain Associated symptoms: shortness of breath        Home Medications Prior to Admission medications   Medication Sig Start Date End Date Taking? Authorizing Provider  aspirin  EC 81 MG EC tablet Take 1 tablet (81 mg total) by mouth at bedtime. Swallow whole. 07/13/21   Onuoha, Josephine C, NP  atorvastatin  (LIPITOR) 80 MG tablet Take 1 tablet (80 mg total) by mouth at bedtime. 07/13/21 08/12/21  Onuoha, Josephine C, NP  cetirizine  (ZYRTEC ) 10 MG tablet Take 10 mg by mouth daily after supper.    [provider]  fluconazole  (DIFLUCAN ) 150 MG tablet Take 1 tablet (150 mg total) by mouth daily. 07/18/22   Trish Furl, MD  fluticasone (FLONASE) 50 MCG/ACT nasal spray Place 1 spray into both nostrils at bedtime.    [provider]  levothyroxine  (SYNTHROID ) 75 MCG tablet Take 1 tablet (75 mcg total) by mouth daily before breakfast. 07/14/21 08/13/21  Onuoha, Josephine C, NP  lisinopril  (ZESTRIL ) 20 MG tablet Take 1 tablet (20 mg total) by mouth at bedtime. 07/13/21 08/12/21  Onuoha, Josephine C, NP  Multiple Vitamins-Minerals (ADULT ONE DAILY GUMMIES) CHEW Chew 1 tablet by mouth daily after breakfast. Centrum     [provider]  traZODone  (DESYREL ) 50 MG tablet Take 0.5 tablets (25 mg total) by mouth at bedtime. 07/13/21 08/12/21  Onuoha, Josephine C, NP      Allergies    Patient has no known allergies.    Review of Systems   Review of Systems  Respiratory:  Positive for shortness of breath.   Cardiovascular:  Positive for chest pain.    Physical Exam Updated Vital Signs BP 132/63   Pulse 71   Temp 99.1 F (37.3 C) (Oral)   Resp 13   Wt 56.2 kg   LMP 04/24/2012   SpO2 98%   BMI 23.43 kg/m  Physical Exam Vitals and nursing note reviewed.  HENT:     Head: Normocephalic and atraumatic.  Eyes:     Pupils: Pupils are equal, round, and reactive to light.  Cardiovascular:     Rate and Rhythm: Normal rate and regular rhythm.  Pulmonary:     Effort: Pulmonary effort is normal.     Breath sounds: Normal breath sounds.  Abdominal:     Palpations: Abdomen is soft.     Tenderness: There is no abdominal tenderness.  Skin:    General: Skin is warm and dry.  Neurological:     General: No focal deficit present.     Mental Status: She is alert.     Comments: Aphasic, baseline  Psychiatric:        Mood and Affect: Mood normal.     ED Results / Procedures / Treatments   Labs (all labs ordered are listed, but only abnormal results are displayed) Labs Reviewed  BASIC METABOLIC PANEL WITH GFR - Abnormal; Notable for the following components:      Result Value   Glucose, Bld 133 (*)    All other components within normal limits  CBC  TROPONIN T, HIGH SENSITIVITY  TROPONIN T, HIGH SENSITIVITY    EKG EKG Interpretation Date/Time:  Tuesday September 15 2023 18:32:57 EDT Ventricular Rate:  59 PR Interval:  254 QRS Duration:  86 QT Interval:  473 QTC Calculation: 469 R Axis:   66  Text Interpretation: Sinus rhythm Prolonged PR interval Low voltage, precordial leads Confirmed by Rafael Bun [phone]) on 09/15/2023 7:01:07 PM  Radiology DG Chest 2 View Result Date:  09/15/2023 CLINICAL DATA:  Chest pain and shortness of breath EXAM: CHEST - 2 VIEW COMPARISON:  07/29/2021 FINDINGS: Cardiac shadow is within normal limits. Tortuous thoracic aorta is noted with calcification. Lungs are well aerated bilaterally. No focal infiltrate or effusion is seen. No bony abnormality is noted. IMPRESSION: No acute abnormality seen. Electronically Signed   By: Violeta Grey M.D.   On: 09/15/2023 19:10    Procedures Procedures    Medications Ordered in ED Medications - No data to display  ED Course/ Medical Decision Making/ A&P                                 Medical Decision Making 73 year old female with history as above sent in from PCP office given concern for chest pain shortness of breath and bradycardia.  Afebrile normotensive no active chest pain or shortness of breath on my assessment.  EKG shows sinus bradycardia at a rate of 59 bpm.  She is in the low 60s on the monitor when I am in the room.  No evidence of heart block or other dysrhythmia.  High sensitive troponin with delta troponin less than 15.  Low low suspicion for ACS at this time.  No significant electrolyte imbalance or evidence of anemia.  Chest x-ray shows no focal consolidation or other abnormality.  Unclear cause of patient's shortness of breath and anxiety but she is appropriate for PCP follow-up and discharge at this time  Amount and/or Complexity of Data Reviewed Labs: ordered. Radiology: ordered.           Final Clinical Impression(s) / ED Diagnoses Final diagnoses:  Dyspnea, unspecified type    Rx / DC Orders ED Discharge Orders     None         Sallyanne Creamer, DO 09/15/23 2108

## 2023-09-15 NOTE — Discharge Instructions (Signed)
 You were seen in the emergency room for shortness of breath chest pain and low heart rate The heart rate was slightly lower than average but there was no evidence of block or other EKG abnormality Your blood pressure was stable Blood work and chest x-ray looked okay We do not feel you are having a heart attack at this time Continue take all previous prescribed medications and follow-up with your primary care doctor 1 week for reevaluation Return to the emergency department for severe chest pain trouble breathing or any other concerns

## 2024-02-12 NOTE — Progress Notes (Signed)
 [facility] Sick Patient Visit Melinda Hinton DOB: Jun 15, 1950  AGE: 73 y.o. SEX:  female MRN: [id number] Visit Date: 02/12/2024  Encounter Provider: Friddie Norman Ahr, PA-C  Chief Complaint  Patient presents with  . Cough    Pt Is non verbal and is here with son.Pt c/o coughing, very fatigued, slight fever and stomach pain for the last week. OTC medication used ( tylenol  and ibuprofen )     SUBJECTIVE:  History of Present Illness Melinda Hinton is a 73 y.o. female who presents today for sick patient visit. Patient usually follows with Virginia  Fulbright PA-C.    She presents today with concern for cough, abdominal pain, and fatigue. Symptoms have been present for about 1 week. She has had low grade fever of 99 F. She has been trying tylenol  and ibuprofen . She tried nyquil but couldn't swallow the capsules. No vomiting or diarrhea. Her son feels her breathing is worse than her last visit for similar symptoms about 3 weeks ago.   Problem List[1] Medical History[2] Surgical History[3] Family History[4] Social History   Socioeconomic History  . Marital status: Widowed    Spouse name: Not on file  . Number of children: Not on file  . Years of education: Not on file  . Highest education level: Not on file  Occupational History  . Not on file  Tobacco Use  . Smoking status: Every Day    Current packs/day: 1.00    Average packs/day: 1 pack/day for 58.8 years (58.8 ttl pk-yrs)    Types: Cigarettes    Start date: 46  . Smokeless tobacco: Never  . Tobacco comments:    Per 2025 LS form/chart (1)   Vaping Use  . Vaping status: Never Used  Substance and Sexual Activity  . Alcohol use: No  . Drug use: No  . Sexual activity: Not on file  Other Topics Concern  . Not on file  Social History Narrative  . Not on file   Social Drivers of Health   Food Insecurity: Low Risk  (01/21/2024)   Food vital sign   . Within the past 12 months, you worried that  your food would run out before you got money to buy more: Never true   . Within the past 12 months, the food you bought just didn't last and you didn't have money to get more: Never true  Transportation Needs: No Transportation Needs (01/21/2024)   Transportation   . In the past 12 months, has lack of reliable transportation kept you from medical appointments, meetings, work or from getting things needed for daily living? : No  Safety: Low Risk  (01/21/2024)   Safety   . How often does anyone, including family and friends, physically hurt you?: Never   . How often does anyone, including family and friends, insult or talk down to you?: Never   . How often does anyone, including family and friends, threaten you with harm?: Never   . How often does anyone, including family and friends, scream or curse at you?: Never  Living Situation: Low Risk  (01/21/2024)   Living Situation   . What is your living situation today?: I have a steady place to live   . Think about the place you live. Do you have problems with any of the following? Choose all that apply:: None/None on this list   Allergies[5]  OBJECTIVE:   Vitals:   02/12/24 1328  Pulse: 74  Temp: (!) 94.7 F (34.8  C)  TempSrc: Temporal  SpO2: 95%  Weight: 57.3 kg (126 lb 6.4 oz)  Height: 1.6 m (5' 3)    Physical Exam Vitals and nursing note reviewed.  Constitutional:      Appearance: Normal appearance.  HENT:     Head: Normocephalic.  Eyes:     Conjunctiva/sclera: Conjunctivae normal.  Cardiovascular:     Rate and Rhythm: Normal rate and regular rhythm.  Pulmonary:     Effort: Pulmonary effort is normal.     Breath sounds: Decreased air movement present. Wheezing and rhonchi present.  Skin:    General: Skin is warm and dry.  Neurological:     Mental Status: She is alert.     Gait: Gait normal.  Psychiatric:        Mood and Affect: Mood normal.        Assessment & Plan COPD exacerbation    (CMD) - Exam concerning  for COPD exacerbation, not hypoxic - Rapid flu and COVID testing negative - Will get chest x-ray to rule out consolidating process - Will treat with antibiotics, tessalon, steroids, and continue breathing treatments - Recommend ipratropium nebulizer TID - Given very strict ED precautions and son verbalizes understanding Orders: .  POC Rapid Influenza A & B Antigen .  POCT COVID BD .  benzonatate (TESSALON) 200 mg capsule; Take 1 capsule (200 mg total) by mouth 3 (three) times a day as needed for cough. .  XR Chest 2 Views; Future .  amoxicillin -pot clavulanate (AUGMENTIN ) 875-125 mg per tablet; Take 1 tablet by mouth 2 (two) times a day for 10 days. .  predniSONE (STERAPRED) 5 mg DsPk dose pack; Use as Directed On Package.    Discussed any prescription(s) noted above, including potential side effects, risks/benefits, drug interactions, instructions for taking the medication(s), and the consequences of not taking as recommended. Patient verbalized an understanding of this information and instructions.  They declined any further questions.  The patient understands to report or return to the clinic for any side effects or concerns, as needed.  All questions have been answered to patient's satisfaction. After visit instructions were provided at the end of the encounter to enforce goals and plan of care.  Return for Next scheduled follow up..  Otherwise, the patient will report or return to clinic for any new or worsening symptoms, as needed.  The patient agrees with the above plan, and verbalizes understanding.  Electronically signed: Friddie Gee Roemhildt, PA-C 02/12/2024  2:09 PM  Note: Portions of this documentation may have been made with the assistance of dictation software and thus could contain potential errors. Effort was made to review and correct errors.       [1] Patient Active Problem List Diagnosis  . Aphasia  . Cerebral aneurysm  . Chest pain  . Essential hematuria  .  Essential hypertension  . Frequent falls  . History of stroke  . Hearing loss  . Hypothyroidism  . Major depressive disorder with single episode  . Mixed hyperlipidemia  . Cigarette smoker  . Poor balance  . Seizure (HCC)  . Takotsubo cardiomyopathy  . ETD (Eustachian tube dysfunction), right  . Perforation of left tympanic membrane  . Mixed conductive and sensorineural hearing loss of left ear with restricted hearing of right ear  . History of removal of skin mole  . Centrilobular emphysema (HCC)  . DOE (dyspnea on exertion)  . Restrictive lung disease  . Lung nodules  . Sleep apnea  . Tobacco use  .  Bilateral impacted cerumen  . COPD (chronic obstructive pulmonary disease)  . H/O: CVA (cerebrovascular accident)  . Nicotine dependence  . Vasomotor rhinitis  . Poisoning by selective serotonin and norepinephrine reuptake inhibitors, intentional self-harm, initial encounter  [2] Past Medical History: Diagnosis Date  . Emphysema of lung   . Hypercholesterolemia   . Hypertension   . Stroke    (CMD)   [3] Past Surgical History: Procedure Laterality Date  . ARTERIAL ANEURYSM REPAIR     Procedure: ARTERIAL ANEURYSM REPAIR  . CESAREAN SECTION, UNSPECIFIED     Procedure: CESAREAN SECTION  . HYSTERECTOMY      Procedure: HYSTERECTOMY  . INNER EAR SURGERY     Procedure: INNER EAR SURGERY  . TONSILLECTOMY     Procedure: TONSILLECTOMY  . TYMPANOSTOMY TUBE PLACEMENT Right 05/07/2021   Surgeon: Dr. Burnie  [4] Family History Problem Relation Name Age of Onset  . Cancer Mother         breast  . Scleroderma Mother    . Pulmonary fibrosis Mother    . Hyperlipidemia Father    . Stroke Maternal Grandmother    . Heart disease Paternal Grandfather    . Breast cancer Neg Hx    [5] No Known Allergies

## 2024-02-21 ENCOUNTER — Other Ambulatory Visit: Payer: Self-pay

## 2024-02-21 ENCOUNTER — Inpatient Hospital Stay (HOSPITAL_BASED_OUTPATIENT_CLINIC_OR_DEPARTMENT_OTHER)
Admission: EM | Admit: 2024-02-21 | Discharge: 2024-02-26 | DRG: 193 | Disposition: A | Discharge status: Home / Self Care | Attending: Internal Medicine | Admitting: Internal Medicine

## 2024-02-21 ENCOUNTER — Emergency Department (HOSPITAL_BASED_OUTPATIENT_CLINIC_OR_DEPARTMENT_OTHER)

## 2024-02-21 ENCOUNTER — Encounter (HOSPITAL_BASED_OUTPATIENT_CLINIC_OR_DEPARTMENT_OTHER): Payer: Self-pay | Admitting: Emergency Medicine

## 2024-02-21 DIAGNOSIS — E039 Hypothyroidism, unspecified: Secondary | ICD-10-CM | POA: Diagnosis present

## 2024-02-21 DIAGNOSIS — J441 Chronic obstructive pulmonary disease with (acute) exacerbation: Principal | ICD-10-CM | POA: Diagnosis present

## 2024-02-21 DIAGNOSIS — I69320 Aphasia following cerebral infarction: Secondary | ICD-10-CM

## 2024-02-21 DIAGNOSIS — J9621 Acute and chronic respiratory failure with hypoxia: Secondary | ICD-10-CM | POA: Diagnosis present

## 2024-02-21 DIAGNOSIS — Z66 Do not resuscitate: Secondary | ICD-10-CM | POA: Diagnosis present

## 2024-02-21 DIAGNOSIS — I5181 Takotsubo syndrome: Secondary | ICD-10-CM | POA: Diagnosis present

## 2024-02-21 DIAGNOSIS — R531 Weakness: Secondary | ICD-10-CM

## 2024-02-21 DIAGNOSIS — J9601 Acute respiratory failure with hypoxia: Secondary | ICD-10-CM | POA: Diagnosis present

## 2024-02-21 DIAGNOSIS — I252 Old myocardial infarction: Secondary | ICD-10-CM

## 2024-02-21 DIAGNOSIS — Z79890 Hormone replacement therapy (postmenopausal): Secondary | ICD-10-CM

## 2024-02-21 DIAGNOSIS — R29818 Other symptoms and signs involving the nervous system: Secondary | ICD-10-CM

## 2024-02-21 DIAGNOSIS — J439 Emphysema, unspecified: Secondary | ICD-10-CM | POA: Diagnosis present

## 2024-02-21 DIAGNOSIS — Z79899 Other long term (current) drug therapy: Secondary | ICD-10-CM

## 2024-02-21 DIAGNOSIS — R8290 Unspecified abnormal findings in urine: Secondary | ICD-10-CM | POA: Diagnosis present

## 2024-02-21 DIAGNOSIS — J189 Pneumonia, unspecified organism: Secondary | ICD-10-CM | POA: Diagnosis not present

## 2024-02-21 DIAGNOSIS — F17210 Nicotine dependence, cigarettes, uncomplicated: Secondary | ICD-10-CM | POA: Diagnosis present

## 2024-02-21 DIAGNOSIS — Z7982 Long term (current) use of aspirin: Secondary | ICD-10-CM

## 2024-02-21 DIAGNOSIS — Z720 Tobacco use: Secondary | ICD-10-CM | POA: Diagnosis present

## 2024-02-21 DIAGNOSIS — E7800 Pure hypercholesterolemia, unspecified: Secondary | ICD-10-CM | POA: Diagnosis present

## 2024-02-21 DIAGNOSIS — E860 Dehydration: Secondary | ICD-10-CM | POA: Diagnosis present

## 2024-02-21 DIAGNOSIS — I6930 Unspecified sequelae of cerebral infarction: Secondary | ICD-10-CM

## 2024-02-21 DIAGNOSIS — I10 Essential (primary) hypertension: Secondary | ICD-10-CM | POA: Diagnosis present

## 2024-02-21 DIAGNOSIS — Z1152 Encounter for screening for COVID-19: Secondary | ICD-10-CM

## 2024-02-21 DIAGNOSIS — E870 Hyperosmolality and hypernatremia: Secondary | ICD-10-CM | POA: Diagnosis present

## 2024-02-21 DIAGNOSIS — Z7952 Long term (current) use of systemic steroids: Secondary | ICD-10-CM

## 2024-02-21 DIAGNOSIS — E785 Hyperlipidemia, unspecified: Secondary | ICD-10-CM

## 2024-02-21 DIAGNOSIS — G40909 Epilepsy, unspecified, not intractable, without status epilepticus: Secondary | ICD-10-CM | POA: Diagnosis present

## 2024-02-21 DIAGNOSIS — E871 Hypo-osmolality and hyponatremia: Secondary | ICD-10-CM | POA: Diagnosis present

## 2024-02-21 DIAGNOSIS — J440 Chronic obstructive pulmonary disease with acute lower respiratory infection: Secondary | ICD-10-CM | POA: Diagnosis present

## 2024-02-21 LAB — CBC WITH DIFFERENTIAL/PLATELET
Abs Immature Granulocytes: 0.3 K/uL — ABNORMAL HIGH (ref 0.00–0.07)
Basophils Absolute: 0.1 K/uL (ref 0.0–0.1)
Basophils Relative: 1 %
Eosinophils Absolute: 0.1 K/uL (ref 0.0–0.5)
Eosinophils Relative: 1 %
HCT: 47.8 % — ABNORMAL HIGH (ref 36.0–46.0)
Hemoglobin: 16 g/dL — ABNORMAL HIGH (ref 12.0–15.0)
Immature Granulocytes: 3 %
Lymphocytes Relative: 13 %
Lymphs Abs: 1.6 K/uL (ref 0.7–4.0)
MCH: 30.8 pg (ref 26.0–34.0)
MCHC: 33.5 g/dL (ref 30.0–36.0)
MCV: 91.9 fL (ref 80.0–100.0)
Monocytes Absolute: 0.8 K/uL (ref 0.1–1.0)
Monocytes Relative: 7 %
Neutro Abs: 9.1 K/uL — ABNORMAL HIGH (ref 1.7–7.7)
Neutrophils Relative %: 75 %
Platelets: 412 K/uL — ABNORMAL HIGH (ref 150–400)
RBC: 5.2 MIL/uL — ABNORMAL HIGH (ref 3.87–5.11)
RDW: 13.4 % (ref 11.5–15.5)
WBC: 12 K/uL — ABNORMAL HIGH (ref 4.0–10.5)
nRBC: 0 % (ref 0.0–0.2)

## 2024-02-21 LAB — COMPREHENSIVE METABOLIC PANEL WITH GFR
ALT: 25 U/L (ref 0–44)
AST: 24 U/L (ref 15–41)
Albumin: 4.6 g/dL (ref 3.5–5.0)
Alkaline Phosphatase: 97 U/L (ref 38–126)
Anion gap: 16 — ABNORMAL HIGH (ref 5–15)
BUN: 32 mg/dL — ABNORMAL HIGH (ref 8–23)
CO2: 26 mmol/L (ref 22–32)
Calcium: 9.9 mg/dL (ref 8.9–10.3)
Chloride: 105 mmol/L (ref 98–111)
Creatinine, Ser: 0.91 mg/dL (ref 0.44–1.00)
GFR, Estimated: >60 mL/min (ref >60)
Glucose, Bld: 116 mg/dL — ABNORMAL HIGH (ref 70–99)
Potassium: 3.8 mmol/L (ref 3.5–5.1)
Sodium: 146 mmol/L — ABNORMAL HIGH (ref 135–145)
Total Bilirubin: 0.7 mg/dL (ref 0.0–1.2)
Total Protein: 7.9 g/dL (ref 6.5–8.1)

## 2024-02-21 LAB — RESP PANEL BY RT-PCR (RSV, FLU A&B, COVID)  RVPGX2
Influenza A by PCR: NEGATIVE
Influenza B by PCR: NEGATIVE
Resp Syncytial Virus by PCR: NEGATIVE
SARS Coronavirus 2 by RT PCR: NEGATIVE

## 2024-02-21 MED ORDER — LACTATED RINGERS IV BOLUS
1000.0000 mL | Freq: Once | INTRAVENOUS | Status: AC
  Administered 2024-02-21: 1000 mL via INTRAVENOUS

## 2024-02-21 MED ORDER — IPRATROPIUM BROMIDE 0.02 % IN SOLN
0.5000 mg | Freq: Once | RESPIRATORY_TRACT | Status: AC
  Administered 2024-02-21: 0.5 mg via RESPIRATORY_TRACT
  Filled 2024-02-21: qty 2.5

## 2024-02-21 MED ORDER — NICOTINE 21 MG/24HR TD PT24
21.0000 mg | MEDICATED_PATCH | Freq: Once | TRANSDERMAL | Status: AC
  Administered 2024-02-21: 21 mg via TRANSDERMAL
  Filled 2024-02-21: qty 1

## 2024-02-21 MED ORDER — ALBUTEROL SULFATE (2.5 MG/3ML) 0.083% IN NEBU
2.5000 mg | INHALATION_SOLUTION | Freq: Once | RESPIRATORY_TRACT | Status: AC
  Administered 2024-02-21: 2.5 mg via RESPIRATORY_TRACT
  Filled 2024-02-21: qty 3

## 2024-02-21 MED ORDER — ALBUTEROL SULFATE (2.5 MG/3ML) 0.083% IN NEBU
5.0000 mg | INHALATION_SOLUTION | Freq: Once | RESPIRATORY_TRACT | Status: AC
  Administered 2024-02-21: 5 mg via RESPIRATORY_TRACT
  Filled 2024-02-21: qty 6

## 2024-02-21 MED ORDER — METHYLPREDNISOLONE SODIUM SUCC 125 MG IJ SOLR
125.0000 mg | Freq: Once | INTRAMUSCULAR | Status: AC
  Administered 2024-02-21: 125 mg via INTRAVENOUS
  Filled 2024-02-21: qty 2

## 2024-02-21 NOTE — Progress Notes (Signed)
 Melinda Hinton is a 73 y.o. female presents today for uri symptoms. Pt son states she has been sick for weeks, has been giving meds but nothing has seemed to help. Pt is coughing up flem, fatigue, loss of appetite, sore throat.

## 2024-02-21 NOTE — Progress Notes (Signed)
 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  Patient was initially seen by her PCP on 11/7 and diagnosed with COPD exacerbation.  She was started on prednisone and Augmentin .  She has not had any improvement therefore came to the ED.  She is noted to be hypoxic with SpO2 85% when standing or with minimal exertion.  She was placed on 2 L O2 via [place] with improvement to 98%.  WBC 12.0.  Labs and vitals otherwise relatively stable.  COVID, influenza, RSV PCR negative.  CT chest without contrast showed diffuse pulmonary emphysema and bullous disease and a single 6 mm solid pleural-based right upper lobe pulmonary nodule.  Patient was given 1 L LR, IV Solu-Medrol 125 mg, Atrovent treatment, albuterol nebulizer x 3.  The hospitalist service was consulted for admission.  Plan of care: The patient is accepted for admission to Med-surg  unit, at [facility] or [place], first available.  Author: Jorie Blanch, MD 02/21/2024  Check [email or web address] for on-call coverage.  Nursing staff, Please call [facility] Admits & Consults System-Wide number on Amion as soon as patient's arrival, so appropriate admitting provider can evaluate the pt.

## 2024-02-21 NOTE — ED Notes (Signed)
 Reminded pt of necessity of urine sample

## 2024-02-21 NOTE — ED Notes (Addendum)
 Assessed pt for toileting needs. Pt states that she is continent and will let medical staff know when she needs to urinate. Pt refusing to have brief checked. Pt refusing assistance to restroom, bedpan, and purewick at this time.

## 2024-02-21 NOTE — ED Triage Notes (Signed)
 Pt with cough, fatigue, decreased appetite x 2 wks; was seen at [facility] and treated with abx; no improvement

## 2024-02-21 NOTE — ED Notes (Signed)
 Attempted to ambulate, patient SPO2 decreased  to 85% standing up. Placed patient back in bed and connected patient to pulse ox, heart monitor. Placed patient on 2 LPM nasal cannula.SPO2 increased to 98%. Patient did not ambulate today.

## 2024-02-21 NOTE — ED Notes (Signed)
 Patient transferred from waiting room to ED treatment room. Assuming pt care at this time.

## 2024-02-21 NOTE — ED Notes (Signed)
 Pt aware of necessity of urine sample.

## 2024-02-21 NOTE — ED Notes (Addendum)
 Pt transported to CT at this time.

## 2024-02-21 NOTE — ED Notes (Signed)
 ED Provider at bedside.

## 2024-02-21 NOTE — Progress Notes (Addendum)
 Subjective Patient ID: Melinda Hinton is a 73 y.o. female.    73 year old female with a history of hypertension, high cholesterol and thyroid disease and stroke with expressive aphasia reports to the clinic with her son after being treated last week for exacerbation of COPD with a prednisone taper, Augmentin , Tessalon Perles and had obtained a chest x-ray at that time that showed no changes to current COPD.  Patient son states that her symptoms are persisting and worsening despite previous treatments. Patient reports cough and congestion and sore throat pain.  Patient is afebrile in clinic with a pulse ox of 98% on room air presently.  Denies, chest pain, shortness of breath, nausea, vomiting, diarrhea,, chills.     Review of Systems  Constitutional:  Positive for fatigue.  HENT:  Positive for congestion and sore throat. Negative for sinus pressure and sinus pain.   Respiratory:  Positive for cough and shortness of breath.   Gastrointestinal:  Negative for diarrhea, nausea and vomiting.  Neurological:  Negative for headaches.    Patient History  Allergies: No Known Allergies  Past Medical History:  Diagnosis Date  . Disease of thyroid gland   . Hypercholesteremia   . Hypertension   . Stroke (CMS/HCC)    History reviewed. No pertinent surgical history. Social History   Socioeconomic History  . Marital status: Widowed    Spouse name: Not on file  . Number of children: Not on file  . Years of education: Not on file  . Highest education level: Not on file  Occupational History  . Not on file  Tobacco Use  . Smoking status: Every Day    Types: Cigarettes  . Smokeless tobacco: Not on file  Substance and Sexual Activity  . Alcohol use: Not Currently  . Drug use: Not on file  . Sexual activity: Defer  Other Topics Concern  . Not on file  Social History Narrative  . Not on file   History reviewed. No pertinent family history. Current Outpatient Medications on File Prior  to Visit  Medication Sig Dispense Refill  . amLODIPine  (Norvasc ) 5 MG tablet Take 5 mg by mouth 1 (one) time each day.    . amoxicillin -clavulanate (Augmentin ) 875-125 MG tablet Take 1 tablet by mouth in the morning and 1 tablet in the evening.    SABRA armodafinil (Nuvigil) 150 MG tablet Take 75-150 mg by mouth 1 (one) time each day.    . ASPIRIN  81 PO Take 81 mg by mouth.    . atorvastatin  (Lipitor) 80 MG tablet Take 80 mg by mouth.    . benzonatate (Tessalon) 200 MG capsule Take 200 mg by mouth 3 (three) times a day if needed.    . cetirizine  (ZyrTEC ) 10 MG tablet Take 10 mg by mouth.    . cyanocobalamin (Vitamin B-12) 2500 MCG tablet Take 1 tablet by mouth 1 (one) time each day.    . donepezil (Aricept) 10 MG tablet Take 10 mg by mouth 1 (one) time each day.    . fluticasone (Flonase) 50 MCG/ACT nasal spray Administer 1 spray into affected nostril(s).    SABRA ipratropium (Atrovent) 0.02 % nebulizer solution Inhale 0.5 mg in the morning and 0.5 mg at noon and 0.5 mg in the evening.    . levETIRAcetam XR (Keppra XR) 500 MG 24 hr tablet TAKE 2 TABLETS EVERY NIGHT    . levothyroxine  (Synthroid , Levoxyl ) 75 MCG tablet     . lisinopril  20 MG tablet Take 20 mg by mouth.    SABRA  Melatonin 10 MG tablet Take by mouth.    . Multiple Vitamins-Minerals (Adult One Daily Gummies) chewable tablet Chew 1 tablet.    SABRA omeprazole (PriLOSEC) 20 MG DR capsule Take 20 mg by mouth 1 (one) time each day.    . predniSONE 5 MG (21) tablet therapy pack See administration instructions. see package    . temazepam (Restoril) 15 MG capsule Take 1 capsule nightly for 7-10 days, if symptoms continue increase to 2 caps nightly    . modafinil (Provigil) 200 MG tablet  (Patient not taking: Reported on 02/21/2024)    . traZODone  (Desyrel ) 50 MG tablet  (Patient not taking: Reported on 02/21/2024)     No current facility-administered medications on file prior to visit.    Objective  Vitals:   02/21/24 1442  BP: 114/89  BP  Location: Left arm  Patient Position: Sitting  BP Cuff Size: Adult  Pulse: 94  Resp: 16  Temp: (!) 36.2 C (97.2 F) Comment: provider notified  TempSrc: Oral  SpO2: 98%  Weight: 55 kg  Height: 5' 4  PainSc: 0-No pain        OBGYN/Pregnancy Status: Hysterectomy      No results found.  Physical Exam Vitals and nursing note reviewed.  Constitutional:      General: She is not in acute distress.    Appearance: Normal appearance. She is not ill-appearing.  HENT:     Head: Normocephalic.     Right Ear: Tympanic membrane, ear canal and external ear normal.     Left Ear: Tympanic membrane, ear canal and external ear normal.     Nose: Congestion present. No rhinorrhea.     Mouth/Throat:     Pharynx: Oropharynx is clear. No oropharyngeal exudate or posterior oropharyngeal erythema.  Eyes:     General:        Right eye: No discharge.        Left eye: No discharge.  Cardiovascular:     Rate and Rhythm: Normal rate and regular rhythm.  Pulmonary:     Effort: Pulmonary effort is normal. No respiratory distress.     Breath sounds: Decreased air movement present. No stridor. Examination of the right-upper field reveals decreased breath sounds. Examination of the left-upper field reveals decreased breath sounds. Examination of the right-middle field reveals decreased breath sounds. Examination of the left-middle field reveals decreased breath sounds. Examination of the right-lower field reveals decreased breath sounds. Examination of the left-lower field reveals decreased breath sounds. Decreased breath sounds present. No wheezing, rhonchi or rales.     Comments: Decreased breath sounds throughout all lung fields Chest:     Chest wall: No tenderness.  Musculoskeletal:     Cervical back: Normal range of motion.  Skin:    General: Skin is warm and dry.  Neurological:     Mental Status: She is alert.     No results found for this visit on 02/21/24.     Procedures MDM:     1+  Chronic illness with exacerbation, progression or side effects of treatment     Risk:: High            Assessment/Plan Diagnoses and all orders for this visit:  COPD with exacerbation (CMS/HCC)     Disposition Status: Emergency Department  Patient Instructions  Patient and patient son instructed to go to ER for evaluation of COPD exacerbation. Patient son verbalized understanding  Thank you for allowing us  to treat you today.  It is important to  recognize some of the limitations of this Urgent Care.  With not having the ability for stat blood work and advanced imaging, it is possible that the exact etiology of your complaint cannot be determined fully.  Go to the emergency room for evaluation and treatment.  Please continue to follow up with your primary care physician.    Progress note signed by Charlies Guise, NP on 02/21/24 at  3:46 PM

## 2024-02-21 NOTE — ED Notes (Signed)
 PLEASE CALL SON LANCE FOR UPDATE IF HE IS NOT AT BEDSIDE #[phone]

## 2024-02-21 NOTE — ED Provider Notes (Cosign Needed Addendum)
 [place] [facility] Provider Note   CSN: [id number] Arrival date & time: 02/21/24  1505     Patient presents with: Cough   Melinda Hinton is a 73 y.o. female.    Patient with history of COPD/emphysema, high cholesterol, hypertension, Takotsubo cardiomyopathy with recovered EF on most recent echocardiogram 11/2023 --presents to the emergency department with her son today for evaluation of ongoing cough and fatigue.  Patient has had respiratory symptoms for about 2 to 3 weeks.  This includes a congested cough, sore throat, nasal congestion and runny nose.  Patient lives with her son and his wife.  They had respiratory symptoms starting after the patient, however they have improved.  Patient was seen on 02/12/24 for the symptoms.  She was placed on steroids, Augmentin , encouraged to continue nebulizers at home.  She had been using nebulizers up to 3 times a day, but did not use them yesterday as she spent most of the day in bed due to profound fatigue.  They went to the urgent care today and were referred to the emergency department as she was not improving.  No chest pain reported.  Patient has a history of UTIs, no increased frequency, urgency, or pain.       Prior to Admission medications   Medication Sig Start Date End Date Taking? Authorizing Provider  aspirin  EC 81 MG EC tablet Take 1 tablet (81 mg total) by mouth at bedtime. Swallow whole. 07/13/21   Onuoha, Josephine C, NP  atorvastatin  (LIPITOR ) 80 MG tablet Take 1 tablet (80 mg total) by mouth at bedtime. 07/13/21 08/12/21  Onuoha, Josephine C, NP  cetirizine  (ZYRTEC ) 10 MG tablet Take 10 mg by mouth daily after supper.    [provider]  fluconazole  (DIFLUCAN ) 150 MG tablet Take 1 tablet (150 mg total) by mouth daily. 07/18/22   Randol Simmonds, MD  fluticasone  (FLONASE ) 50 MCG/ACT nasal spray Place 1 spray into both nostrils at bedtime.    [provider]  levothyroxine  (SYNTHROID ) 75  MCG tablet Take 1 tablet (75 mcg total) by mouth daily before breakfast. 07/14/21 08/13/21  Onuoha, Josephine C, NP  lisinopril  (ZESTRIL ) 20 MG tablet Take 1 tablet (20 mg total) by mouth at bedtime. 07/13/21 08/12/21  Onuoha, Josephine C, NP  Multiple Vitamins-Minerals (ADULT ONE DAILY GUMMIES) CHEW Chew 1 tablet by mouth daily after breakfast. Centrum    [provider]  traZODone  (DESYREL ) 50 MG tablet Take 0.5 tablets (25 mg total) by mouth at bedtime. 07/13/21 08/12/21  Onuoha, Josephine C, NP    Allergies: Patient has no known allergies.    Review of Systems  Updated Vital Signs BP 135/62 (BP Location: Right Arm)   Pulse 96   Temp 98.2 F (36.8 C) (Oral)   Resp (!) 22   Ht 5' 3 (1.6 m)   Wt 55.3 kg   LMP 04/24/2012   SpO2 91%   BMI 21.61 kg/m   Physical Exam Vitals and nursing note reviewed.  Constitutional:      General: She is not in acute distress.    Appearance: She is well-developed.  HENT:     Head: Normocephalic and atraumatic.     Right Ear: External ear normal.     Left Ear: External ear normal.     Nose: Congestion and rhinorrhea present.  Eyes:     Conjunctiva/sclera: Conjunctivae normal.  Cardiovascular:     Rate and Rhythm: Normal rate and regular rhythm.  Heart sounds: No murmur heard. Pulmonary:     Effort: No respiratory distress.     Breath sounds: Wheezing and rhonchi present. No rales.     Comments: Frequent coughing during exam. Abdominal:     Palpations: Abdomen is soft.     Tenderness: There is no abdominal tenderness. There is no guarding or rebound.  Musculoskeletal:     Cervical back: Normal range of motion and neck supple.     Right lower leg: No edema.     Left lower leg: No edema.  Skin:    General: Skin is warm and dry.     Findings: No rash.  Neurological:     General: No focal deficit present.     Mental Status: She is alert. Mental status is at baseline.     Motor: No weakness.  Psychiatric:        Mood and Affect: Mood  normal.     (all labs ordered are listed, but only abnormal results are displayed) Labs Reviewed  CBC WITH DIFFERENTIAL/PLATELET - Abnormal; Notable for the following components:      Result Value   WBC 12.0 (*)    RBC 5.20 (*)    Hemoglobin 16.0 (*)    HCT 47.8 (*)    Platelets 412 (*)    Neutro Abs 9.1 (*)    Abs Immature Granulocytes 0.30 (*)    All other components within normal limits  COMPREHENSIVE METABOLIC PANEL WITH GFR - Abnormal; Notable for the following components:   Sodium 146 (*)    Glucose, Bld 116 (*)    BUN 32 (*)    Anion gap 16 (*)    All other components within normal limits  RESP PANEL BY RT-PCR (RSV, FLU A&B, COVID)  RVPGX2  URINALYSIS, ROUTINE W REFLEX MICROSCOPIC    ED ECG REPORT   Date: 02/21/2024  Rate: 66  Rhythm: normal sinus rhythm  QRS Axis: normal  Intervals: normal  ST/T Wave abnormalities: nonspecific T wave changes  Conduction Disutrbances:none  Narrative Interpretation:   Old EKG Reviewed: unchanged, similar to 07/2021  I have personally reviewed the EKG tracing and agree with the computerized printout as noted.   Radiology: DG Chest 2 View Result Date: 02/21/2024 CLINICAL DATA:  Cough and fatigue with decreased appetite 2 weeks. EXAM: CHEST - 2 VIEW COMPARISON:  09/15/2023, 07/29/2021 FINDINGS: Lungs are adequately inflated with emphysematous disease over the upper lungs. Minimal density over the region of the right middle lobe/lingula on the lateral image likely atelectasis. Early infection is possible. No effusion. Cardiomediastinal silhouette and remainder of the exam is unchanged. IMPRESSION: 1. Minimal density over the region of the right middle lobe/lingula on the lateral image likely atelectasis. Early infection is possible. 2. Emphysematous disease. Electronically Signed   By: Toribio Agreste M.D.   On: 02/21/2024 15:58     Procedures   Medications Ordered in the ED  methylPREDNISolone  sodium succinate (SOLU-MEDROL ) 125 mg/2  mL injection 125 mg (125 mg Intravenous Given 02/21/24 1624)  albuterol  (PROVENTIL ) (2.5 MG/3ML) 0.083% nebulizer solution 2.5 mg (2.5 mg Nebulization Given 02/21/24 1625)  albuterol  (PROVENTIL ) (2.5 MG/3ML) 0.083% nebulizer solution 2.5 mg (2.5 mg Nebulization Given 02/21/24 1624)  lactated ringers  bolus 1,000 mL (0 mLs Intravenous Stopped 02/21/24 1851)  albuterol  (PROVENTIL ) (2.5 MG/3ML) 0.083% nebulizer solution 5 mg (5 mg Nebulization Given 02/21/24 1835)  ipratropium (ATROVENT ) nebulizer solution 0.5 mg (0.5 mg Nebulization Given 02/21/24 1836)   ED Course  Patient seen and examined. History obtained  directly from patient's son, previous urgent care visit notes.  I did review most recent echocardiogram.  Labs/EKG: Ordered CBC, BMP, viral panel, UA.  Imaging: Reviewed and interpreted chest x-ray, there is a small area question atelectasis versus infection.  I have ordered CT chest to further evaluate.  If patient does have area of pneumonia, would likely recommend admission.    Medications/Fluids: Ordered: IV Solu-Medrol , albuterol /Atrovent .   Most recent vital signs reviewed and are as follows: BP 135/62 (BP Location: Right Arm)   Pulse 96   Temp 98.2 F (36.8 C) (Oral)   Resp (!) 22   Ht 5' 3 (1.6 m)   Wt 55.3 kg   LMP 04/24/2012   SpO2 91%   BMI 21.61 kg/m   Initial impression: Persistent cough and shortness of breath in setting of COPD, likely exacerbation, however no improvement with outpatient treatment.  Will evaluate for possible pneumonia.  Low concern for ACS, PE.  Symptoms not consistent with heart failure.  6:31 PM Reassessment performed. Patient appears stable.  We attempted to ambulate patient.  She quickly desaturated to 85% on room air at bedside.  Plan to admit for COPD exacerbation, failure of outpatient therapy, hypoxia.  Labs personally reviewed and interpreted including: CBC with elevated white blood cell count of 12, neutrophil 9.1, hemoglobin mildly  elevated at 16; CMP mild hyponatremia at 146, BUN at 32 with baseline creatinine 0.9; viral panel negative.  Imaging personally visualized and interpreted including: CT without contrast of the chest, agree no signs of pneumonia.  Reviewed pertinent lab work and imaging with patient at bedside. Questions answered.   Most current vital signs reviewed and are as follows: BP (!) 148/88   Pulse 67   Temp 98.2 F (36.8 C) (Oral)   Resp 20   Ht 5' 3 (1.6 m)   Wt 55.3 kg   LMP 04/24/2012   SpO2 100%   BMI 21.61 kg/m   Plan: Admission to hospital.  Patient has been appropriately treated for COPD exacerbation without improvement to this time.  Becomes quickly hypoxic at bedside.  Reports increasing shortness of breath, increasing productive cough, fatigue at home, spending most of the day yesterday in bed.  6:54 PM Discussed with Dr. Tobie, [facility] hospitalist, accepts for admission.  03/14/2024 addendum CRITICAL CARE Performed by: Fonda Ruby Total critical care time: 35 minutes Critical care time was exclusive of separately billable procedures and treating other patients. Critical care was necessary to treat or prevent imminent or life-threatening deterioration. Critical care was time spent personally by me on the following activities: development of treatment plan with patient and/or surrogate as well as nursing, discussions with consultants, evaluation of patient's response to treatment, examination of patient, obtaining history from patient or surrogate, ordering and performing treatments and interventions, ordering and review of laboratory studies, ordering and review of radiographic studies, pulse oximetry and re-evaluation of patient's condition.                                    Medical Decision Making Amount and/or Complexity of Data Reviewed Labs: ordered. Radiology: ordered.  Risk OTC drugs. Prescription drug management. Decision regarding hospitalization.   Patient  with emphysema/COPD, not on chronic oxygen --COPD exacerbation, failure of outpatient treatment including steroids and antibiotics, now hypoxic and deconditioning.  Recommend admission to hospital.     Final diagnoses:  COPD with acute exacerbation (HCC)  Acute on chronic respiratory  failure with hypoxia [facility])    ED Discharge Orders     None          Desiderio Chew, DEVONNA 02/21/24 1854    Dreama Longs, MD 02/22/24 2111    Desiderio Chew, PA-C 03/14/24 1446    Dreama Longs, MD 03/15/24 [phone]

## 2024-02-22 ENCOUNTER — Telehealth: Payer: Self-pay | Admitting: Pulmonary Disease

## 2024-02-22 DIAGNOSIS — E871 Hypo-osmolality and hyponatremia: Secondary | ICD-10-CM | POA: Diagnosis present

## 2024-02-22 DIAGNOSIS — I5181 Takotsubo syndrome: Secondary | ICD-10-CM | POA: Diagnosis present

## 2024-02-22 DIAGNOSIS — Z720 Tobacco use: Secondary | ICD-10-CM | POA: Diagnosis present

## 2024-02-22 DIAGNOSIS — E039 Hypothyroidism, unspecified: Secondary | ICD-10-CM | POA: Diagnosis present

## 2024-02-22 DIAGNOSIS — G40909 Epilepsy, unspecified, not intractable, without status epilepticus: Secondary | ICD-10-CM

## 2024-02-22 DIAGNOSIS — E860 Dehydration: Secondary | ICD-10-CM | POA: Diagnosis present

## 2024-02-22 DIAGNOSIS — I252 Old myocardial infarction: Secondary | ICD-10-CM | POA: Diagnosis not present

## 2024-02-22 DIAGNOSIS — Z1152 Encounter for screening for COVID-19: Secondary | ICD-10-CM | POA: Diagnosis not present

## 2024-02-22 DIAGNOSIS — I6930 Unspecified sequelae of cerebral infarction: Secondary | ICD-10-CM

## 2024-02-22 DIAGNOSIS — J440 Chronic obstructive pulmonary disease with acute lower respiratory infection: Secondary | ICD-10-CM | POA: Diagnosis present

## 2024-02-22 DIAGNOSIS — F17210 Nicotine dependence, cigarettes, uncomplicated: Secondary | ICD-10-CM | POA: Diagnosis present

## 2024-02-22 DIAGNOSIS — Z7952 Long term (current) use of systemic steroids: Secondary | ICD-10-CM | POA: Diagnosis not present

## 2024-02-22 DIAGNOSIS — Z79890 Hormone replacement therapy (postmenopausal): Secondary | ICD-10-CM | POA: Diagnosis not present

## 2024-02-22 DIAGNOSIS — E7800 Pure hypercholesterolemia, unspecified: Secondary | ICD-10-CM | POA: Diagnosis present

## 2024-02-22 DIAGNOSIS — Z79899 Other long term (current) drug therapy: Secondary | ICD-10-CM | POA: Diagnosis not present

## 2024-02-22 DIAGNOSIS — J189 Pneumonia, unspecified organism: Secondary | ICD-10-CM | POA: Diagnosis present

## 2024-02-22 DIAGNOSIS — R8290 Unspecified abnormal findings in urine: Secondary | ICD-10-CM | POA: Diagnosis present

## 2024-02-22 DIAGNOSIS — I69320 Aphasia following cerebral infarction: Secondary | ICD-10-CM | POA: Diagnosis not present

## 2024-02-22 DIAGNOSIS — Z66 Do not resuscitate: Secondary | ICD-10-CM | POA: Diagnosis present

## 2024-02-22 DIAGNOSIS — J9621 Acute and chronic respiratory failure with hypoxia: Secondary | ICD-10-CM | POA: Diagnosis present

## 2024-02-22 DIAGNOSIS — E870 Hyperosmolality and hypernatremia: Secondary | ICD-10-CM | POA: Diagnosis present

## 2024-02-22 DIAGNOSIS — J9601 Acute respiratory failure with hypoxia: Secondary | ICD-10-CM | POA: Diagnosis present

## 2024-02-22 DIAGNOSIS — Z7982 Long term (current) use of aspirin: Secondary | ICD-10-CM | POA: Diagnosis not present

## 2024-02-22 DIAGNOSIS — J441 Chronic obstructive pulmonary disease with (acute) exacerbation: Principal | ICD-10-CM | POA: Diagnosis present

## 2024-02-22 DIAGNOSIS — J439 Emphysema, unspecified: Secondary | ICD-10-CM | POA: Diagnosis present

## 2024-02-22 DIAGNOSIS — I10 Essential (primary) hypertension: Secondary | ICD-10-CM | POA: Diagnosis present

## 2024-02-22 LAB — PHOSPHORUS: Phosphorus: 2.4 mg/dL — ABNORMAL LOW (ref 2.5–4.6)

## 2024-02-22 LAB — URINALYSIS, ROUTINE W REFLEX MICROSCOPIC
Glucose, UA: 100 mg/dL — AB
Hgb urine dipstick: NEGATIVE
Ketones, ur: 15 mg/dL — AB
Nitrite: NEGATIVE
Protein, ur: 100 mg/dL — AB
Specific Gravity, Urine: >=1.03 (ref 1.005–1.030)
pH: 5.5 (ref 5.0–8.0)

## 2024-02-22 LAB — BLOOD GAS, VENOUS
Acid-Base Excess: 2.5 mmol/L — ABNORMAL HIGH (ref 0.0–2.0)
Bicarbonate: 26.3 mmol/L (ref 20.0–28.0)
O2 Saturation: 83.1 %
Patient temperature: 36.6
pCO2, Ven: 36 mmHg — ABNORMAL LOW (ref 44–60)
pH, Ven: 7.47 — ABNORMAL HIGH (ref 7.25–7.43)
pO2, Ven: 48 mmHg — ABNORMAL HIGH (ref 32–45)

## 2024-02-22 LAB — CBC WITH DIFFERENTIAL/PLATELET
Abs Immature Granulocytes: 0.17 K/uL — ABNORMAL HIGH (ref 0.00–0.07)
Basophils Absolute: 0 K/uL (ref 0.0–0.1)
Basophils Relative: 0 %
Eosinophils Absolute: 0 K/uL (ref 0.0–0.5)
Eosinophils Relative: 0 %
HCT: 42.3 % (ref 36.0–46.0)
Hemoglobin: 14.1 g/dL (ref 12.0–15.0)
Immature Granulocytes: 1 %
Lymphocytes Relative: 6 %
Lymphs Abs: 0.7 K/uL (ref 0.7–4.0)
MCH: 30.5 pg (ref 26.0–34.0)
MCHC: 33.3 g/dL (ref 30.0–36.0)
MCV: 91.6 fL (ref 80.0–100.0)
Monocytes Absolute: 0.4 K/uL (ref 0.1–1.0)
Monocytes Relative: 3 %
Neutro Abs: 11.7 K/uL — ABNORMAL HIGH (ref 1.7–7.7)
Neutrophils Relative %: 90 %
Platelets: 361 K/uL (ref 150–400)
RBC: 4.62 MIL/uL (ref 3.87–5.11)
RDW: 13.3 % (ref 11.5–15.5)
WBC: 13 K/uL — ABNORMAL HIGH (ref 4.0–10.5)
nRBC: 0 % (ref 0.0–0.2)

## 2024-02-22 LAB — COMPREHENSIVE METABOLIC PANEL WITH GFR
ALT: 26 U/L (ref 0–44)
AST: 22 U/L (ref 15–41)
Albumin: 3.5 g/dL (ref 3.5–5.0)
Alkaline Phosphatase: 75 U/L (ref 38–126)
Anion gap: 12 (ref 5–15)
BUN: 27 mg/dL — ABNORMAL HIGH (ref 8–23)
CO2: 26 mmol/L (ref 22–32)
Calcium: 9.4 mg/dL (ref 8.9–10.3)
Chloride: 105 mmol/L (ref 98–111)
Creatinine, Ser: 0.7 mg/dL (ref 0.44–1.00)
GFR, Estimated: >60 mL/min (ref >60)
Glucose, Bld: 143 mg/dL — ABNORMAL HIGH (ref 70–99)
Potassium: 3.9 mmol/L (ref 3.5–5.1)
Sodium: 143 mmol/L (ref 135–145)
Total Bilirubin: 0.5 mg/dL (ref 0.0–1.2)
Total Protein: 7.1 g/dL (ref 6.5–8.1)

## 2024-02-22 LAB — SEDIMENTATION RATE: Sed Rate: 26 mm/h — ABNORMAL HIGH (ref 0–22)

## 2024-02-22 LAB — URINALYSIS, MICROSCOPIC (REFLEX)

## 2024-02-22 LAB — TSH: TSH: 0.453 u[IU]/mL (ref 0.350–4.500)

## 2024-02-22 LAB — C-REACTIVE PROTEIN: CRP: 2 mg/dL — ABNORMAL HIGH (ref <1.0)

## 2024-02-22 LAB — PROCALCITONIN: Procalcitonin: <0.1 ng/mL

## 2024-02-22 LAB — MAGNESIUM: Magnesium: 2.1 mg/dL (ref 1.7–2.4)

## 2024-02-22 MED ORDER — FLUTICASONE PROPIONATE 50 MCG/ACT NA SUSP
1.0000 | Freq: Every day | NASAL | Status: DC
  Administered 2024-02-22 – 2024-02-25 (×4): 1 via NASAL
  Filled 2024-02-22 (×2): qty 16

## 2024-02-22 MED ORDER — METHYLPREDNISOLONE SODIUM SUCC 125 MG IJ SOLR
80.0000 mg | Freq: Two times a day (BID) | INTRAMUSCULAR | Status: DC
  Administered 2024-02-22 – 2024-02-23 (×3): 80 mg via INTRAVENOUS
  Filled 2024-02-22 (×3): qty 2

## 2024-02-22 MED ORDER — LORATADINE 10 MG PO TABS
10.0000 mg | ORAL_TABLET | Freq: Every day | ORAL | Status: DC
  Administered 2024-02-22 – 2024-02-26 (×5): 10 mg via ORAL
  Filled 2024-02-22 (×6): qty 1

## 2024-02-22 MED ORDER — ALBUTEROL SULFATE (2.5 MG/3ML) 0.083% IN NEBU
2.5000 mg | INHALATION_SOLUTION | RESPIRATORY_TRACT | Status: DC | PRN
  Filled 2024-02-22 (×6): qty 3

## 2024-02-22 MED ORDER — ACETAMINOPHEN 325 MG PO TABS: 650.0000 mg | ORAL_TABLET | Freq: Four times a day (QID) | ORAL | Status: DC | PRN

## 2024-02-22 MED ORDER — ATORVASTATIN CALCIUM 80 MG PO TABS
80.0000 mg | ORAL_TABLET | Freq: Every day | ORAL | Status: DC
  Administered 2024-02-22 – 2024-02-25 (×4): 80 mg via ORAL
  Filled 2024-02-22 (×5): qty 1

## 2024-02-22 MED ORDER — LEVETIRACETAM ER 500 MG PO TB24
1000.0000 mg | ORAL_TABLET | Freq: Every day | ORAL | Status: DC
  Administered 2024-02-22 – 2024-02-25 (×4): 1000 mg via ORAL
  Filled 2024-02-22 (×7): qty 2

## 2024-02-22 MED ORDER — AMLODIPINE BESYLATE 5 MG PO TABS
5.0000 mg | ORAL_TABLET | Freq: Every day | ORAL | Status: DC
  Administered 2024-02-22 – 2024-02-26 (×5): 5 mg via ORAL
  Filled 2024-02-22 (×6): qty 1

## 2024-02-22 MED ORDER — LISINOPRIL 20 MG PO TABS
40.0000 mg | ORAL_TABLET | Freq: Every day | ORAL | Status: DC
  Administered 2024-02-22 – 2024-02-26 (×5): 40 mg via ORAL
  Filled 2024-02-22 (×6): qty 2

## 2024-02-22 MED ORDER — ALBUTEROL SULFATE (2.5 MG/3ML) 0.083% IN NEBU: 2.5000 mg | INHALATION_SOLUTION | RESPIRATORY_TRACT | Status: DC | PRN

## 2024-02-22 MED ORDER — IPRATROPIUM-ALBUTEROL 0.5-2.5 (3) MG/3ML IN SOLN
3.0000 mL | Freq: Four times a day (QID) | RESPIRATORY_TRACT | Status: DC
  Administered 2024-02-22: 3 mL via RESPIRATORY_TRACT
  Filled 2024-02-22: qty 3

## 2024-02-22 MED ORDER — ACETAMINOPHEN 650 MG RE SUPP: 650.0000 mg | Freq: Four times a day (QID) | RECTAL | Status: DC | PRN

## 2024-02-22 MED ORDER — ASPIRIN 81 MG PO TBEC
81.0000 mg | DELAYED_RELEASE_TABLET | Freq: Every day | ORAL | Status: DC
  Administered 2024-02-22 – 2024-02-25 (×4): 81 mg via ORAL
  Filled 2024-02-22 (×5): qty 1

## 2024-02-22 MED ORDER — DONEPEZIL HCL 10 MG PO TABS
10.0000 mg | ORAL_TABLET | Freq: Every day | ORAL | Status: DC
  Administered 2024-02-22 – 2024-02-26 (×5): 10 mg via ORAL
  Filled 2024-02-22 (×6): qty 1

## 2024-02-22 MED ORDER — PANTOPRAZOLE SODIUM 40 MG PO TBEC
40.0000 mg | DELAYED_RELEASE_TABLET | Freq: Every day | ORAL | Status: DC
  Administered 2024-02-22 – 2024-02-26 (×5): 40 mg via ORAL
  Filled 2024-02-22 (×6): qty 1

## 2024-02-22 MED ORDER — LEVOTHYROXINE SODIUM 75 MCG PO TABS
75.0000 ug | ORAL_TABLET | Freq: Every day | ORAL | Status: DC
  Administered 2024-02-22 – 2024-02-25 (×4): 75 ug via ORAL
  Filled 2024-02-22 (×6): qty 1

## 2024-02-22 MED ORDER — NICOTINE 14 MG/24HR TD PT24: 14.0000 mg | MEDICATED_PATCH | Freq: Every day | TRANSDERMAL | Status: DC

## 2024-02-22 MED ORDER — TEMAZEPAM 15 MG PO CAPS
15.0000 mg | ORAL_CAPSULE | Freq: Every day | ORAL | Status: DC
  Administered 2024-02-22 – 2024-02-25 (×4): 15 mg via ORAL
  Filled 2024-02-22 (×8): qty 1

## 2024-02-22 MED ORDER — MODAFINIL 100 MG PO TABS
100.0000 mg | ORAL_TABLET | Freq: Every day | ORAL | Status: DC
  Administered 2024-02-23 – 2024-02-25 (×3): 100 mg via ORAL
  Filled 2024-02-22 (×7): qty 1

## 2024-02-22 MED ORDER — SODIUM CHLORIDE 0.9 % IV SOLN
2.0000 g | INTRAVENOUS | Status: DC
  Administered 2024-02-22 – 2024-02-26 (×5): 2 g via INTRAVENOUS
  Filled 2024-02-22 (×6): qty 20

## 2024-02-22 MED ORDER — MELATONIN 3 MG PO TABS
3.0000 mg | ORAL_TABLET | Freq: Every evening | ORAL | Status: DC | PRN
  Filled 2024-02-22: qty 1

## 2024-02-22 MED ORDER — NICOTINE POLACRILEX 2 MG MT GUM
2.0000 mg | CHEWING_GUM | OROMUCOSAL | Status: DC | PRN
  Administered 2024-02-22 – 2024-02-25 (×11): 2 mg via ORAL
  Filled 2024-02-22 (×40): qty 1

## 2024-02-22 MED ORDER — ENOXAPARIN SODIUM 40 MG/0.4ML IJ SOSY
40.0000 mg | PREFILLED_SYRINGE | Freq: Every day | INTRAMUSCULAR | Status: DC
  Administered 2024-02-22 – 2024-02-23 (×2): 40 mg via SUBCUTANEOUS
  Filled 2024-02-22 (×2): qty 0.4

## 2024-02-22 MED ORDER — GUAIFENESIN ER 600 MG PO TB12
600.0000 mg | ORAL_TABLET | Freq: Two times a day (BID) | ORAL | Status: DC
  Administered 2024-02-22 – 2024-02-26 (×8): 600 mg via ORAL
  Filled 2024-02-22 (×11): qty 1

## 2024-02-22 MED ORDER — ONDANSETRON HCL 4 MG/2ML IJ SOLN: 4.0000 mg | Freq: Four times a day (QID) | INTRAMUSCULAR | Status: DC | PRN

## 2024-02-22 MED ORDER — ARMODAFINIL 150 MG PO TABS: 75.0000 mg | ORAL_TABLET | Freq: Every morning | ORAL | Status: DC

## 2024-02-22 NOTE — ED Notes (Signed)
 Pt states that she needs to urinate, but that she's too SOB to get up to go to restroom. Offered bedpan to pt to which she adamantly refused. Purewick placed on pt.

## 2024-02-22 NOTE — Evaluation (Signed)
 Physical Therapy Evaluation Patient Details Name: Melinda Hinton MRN: [id number] DOB: 18-Jun-1950 Today's Date: 02/22/2024  History of Present Illness  73 y.o. female presents to [facility] hospital on 02/22/2024 with cough and weakness. Pt was found to be hypoxic in the ED. CT scan notes diffuse pulmonary emphysematous disease. PMH includes COPD, emphysema, HTN, HLD, CVA, cardiomyopathy, aneurysm, tobacco use.  Clinical Impression  Pt presents to PT with deficits in activity tolerance, cardiopulmonary function, gait, balance. Pt typically ambulates with use of a cane, difficult to determine what kind of cane due to pt's expressive aphasia. When mobilizing today the pt benefits from UE support to stabilize, often having to correct for losses of balance when mobilizing without UE support. Pt will benefit from use of a RW to improve stability when mobilizing within the home. Pt continues to require supplemental oxygen at rest and when mobilizing, desaturating to 85% on 2L [place] with ambulation today. Pt will benefit from continued PT services in an effort to improve balance and activity tolerance.        If plan is discharge home, recommend the following: A little help with walking and/or transfers;A little help with bathing/dressing/bathroom;Assistance with cooking/housework;Assist for transportation;Help with stairs or ramp for entrance;Direct supervision/assist for medications management;Direct supervision/assist for financial management   Can travel by private vehicle        Equipment Recommendations 2 Wheeled Rolling Walker  Recommendations for Other Services       Functional Status Assessment Patient has had a recent decline in their functional status and demonstrates the ability to make significant improvements in function in a reasonable and predictable amount of time.     Precautions / Restrictions Precautions Precautions: Fall Recall of Precautions/Restrictions:  (difficult to assess due to  communication impairment) Restrictions Weight Bearing Restrictions Per Provider Order: No      Mobility  Bed Mobility Overal bed mobility: Needs Assistance Bed Mobility: Sidelying to Sit, Sit to Sidelying   Sidelying to sit: Supervision     Sit to sidelying: Supervision General bed mobility comments: increased time    Transfers Overall transfer level: Needs assistance Equipment used: None Transfers: Sit to/from Stand Sit to Stand: Contact guard assist                Ambulation/Gait Ambulation/Gait assistance: Min assist Gait Distance (Feet): 70 Feet Assistive device: 1 person hand held assist Gait Pattern/deviations: Step-through pattern Gait velocity: reduced Gait velocity interpretation: <1.8 ft/sec, indicate of risk for recurrent falls   General Gait Details: slowed step-through gait, multiple small losses of balance when without UE support which pt corrects with stepping strategy. With PT hand hold or use of railing the pt demonstrates improved stability.  Stairs            Wheelchair Mobility     Tilt Bed    Modified Rankin (Stroke Patients Only)       Balance Overall balance assessment: Needs assistance Sitting-balance support: No upper extremity supported, Feet supported Sitting balance-Leahy Scale: Good     Standing balance support: Single extremity supported, Reliant on assistive device for balance Standing balance-Leahy Scale: Poor                               Pertinent Vitals/Pain Pain Assessment Pain Assessment: No/denies pain    Home Living Family/patient expects to be discharged to:: Private residence Living Arrangements: Children Available Help at Discharge: Family;Available PRN/intermittently Type of Home: House Home Access: Level  entry     Alternate Level Stairs-Number of Steps: flight Home Layout: Two level Home Equipment: Cane - single point      Prior Function Prior Level of Function : Needs  assist             Mobility Comments: pt appears to indicate she typically ambulates with a cane, unable to discern if the cane is a single point or quad cane. ADLs Comments: pt indicates she requries assistance for bathing and IADLs at baseline via yes/no responses     Extremity/Trunk Assessment   Upper Extremity Assessment Upper Extremity Assessment: Overall WFL for tasks assessed    Lower Extremity Assessment Lower Extremity Assessment: Overall WFL for tasks assessed    Cervical / Trunk Assessment Cervical / Trunk Assessment: Normal  Communication   Communication Communication: Impaired Factors Affecting Communication: Reduced clarity of speech;Difficulty expressing self    Cognition Arousal: Alert Behavior During Therapy: Flat affect   PT - Cognitive impairments: No family/caregiver present to determine baseline, Difficult to assess Difficult to assess due to: Impaired communication                     PT - Cognition Comments: 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 Pt desats to 85% when ambulating on 2L [place].    Exercises     Assessment/Plan    PT Assessment Patient needs continued PT services  PT Problem List Decreased strength;Decreased activity tolerance;Decreased balance;Decreased mobility;Decreased knowledge of use of DME;Cardiopulmonary status limiting activity;Decreased safety awareness;Decreased knowledge of precautions       PT Treatment Interventions DME instruction;Gait training;Stair training;Functional mobility training;Therapeutic activities;Therapeutic exercise;Balance training;Neuromuscular re-education;Patient/family education    PT Goals (Current goals can be found  in the Care Plan section)  Acute Rehab PT Goals Patient Stated Goal: to improve activity tolerance PT Goal Formulation: With patient Time For Goal Achievement: 03/07/24 Potential to Achieve Goals: Fair    Frequency Min 2X/week     Co-evaluation               AM-PAC PT 6 Clicks Mobility  Outcome Measure Help needed turning from your back to your side while in a flat bed without using bedrails?: A Little Help needed moving from lying on your back to sitting on the side of a flat bed without using bedrails?: A Little Help needed moving to and from a bed to a chair (including a wheelchair)?: A Little Help needed standing up from a chair using your arms (e.g., wheelchair or bedside chair)?: A Little Help needed to walk in hospital room?: A Little Help needed climbing 3-5 steps with a railing? : A Little 6 Click Score: 18    End of Session Equipment Utilized During Treatment: Gait belt;Oxygen Activity Tolerance: Patient tolerated treatment well Patient left: in bed;with call bell/phone within reach;with bed alarm set Nurse Communication: Mobility status PT Visit Diagnosis: Other abnormalities of gait and mobility (R26.89);Muscle weakness (generalized) (M62.81)    Time: 8841-8779 PT Time Calculation (min) (ACUTE ONLY): 22 min   Charges:   PT Evaluation $PT Eval Low Complexity: 1 Low   PT General Charges $$ ACUTE PT VISIT: 1 Visit         Bernardino JINNY Ruth, PT, DPT  Acute Rehabilitation Office [phone]   Bernardino JINNY Ruth 02/22/2024, 12:46 PM

## 2024-02-22 NOTE — ED Notes (Signed)
 [name] (son) called [phone]) per request when mother was picked up by [facility] and transported to [facility]. Information was provided of bed/location.

## 2024-02-22 NOTE — Progress Notes (Signed)
(  Carryover admission to the Day Admitter; accepted by Dr. Tobie as transfer from  [facility]  to a  med-surg bed at  either [facility] or [facility]  for acute COPD exacerbation complicated by acute hypoxic respiratory distress. Please see Dr.  Anthony transfer progress note for additional details).   I have placed some additional preliminary admit orders via the adult multi-morbid admission order set. I have also ordered Solu-Medrol, scheduled duo nebulizer treatments, prn albuterol nebulizer treatments VBG as well as additional morning labs that include CMP, CBC, magnesium and phosphorus levels.    Eva Pore, DO Hospitalist

## 2024-02-22 NOTE — ED Notes (Signed)
 [facility] called for transport.

## 2024-02-22 NOTE — Telephone Encounter (Signed)
 Appointment scheduled.

## 2024-02-22 NOTE — TOC Initial Note (Signed)
 Transition of Care [facility]) - Initial/Assessment Note    Patient Details  Name: Melinda Hinton MRN: [id number] Date of Birth: 09-Aug-1950  Transition of Care [facility]) CM/SW Contact:    Melinda Hinton Melinda Stain, RN Phone Number: 02/22/2024, 10:25 AM  Clinical Narrative:                  Spoke to patient's son, Melinda Hinton, regarding transition needs.  Patient lives with son and daughter in law.  Patient is independent with ADLs at home. Uses CPAP at night.  Family transports to apts.  Patient has no history of home health or SNF. Address, Phone number and PCP verified. ICM (Inpatient Care Management) will continue to follow for needs.  Expected Discharge Plan: Home/Self Care Barriers to Discharge: Continued Medical Work up   Patient Goals and CMS Choice Patient states their goals for this hospitalization and ongoing recovery are:: return home          Expected Discharge Plan and Services                                              Prior Living Arrangements/Services   Lives with:: Adult Children Patient language and need for interpreter reviewed:: Yes Do you feel safe going back to the place where you live?: Yes      Need for Family Participation in Patient Care: Yes (Comment) Care giver support system in place?: Yes (comment) Current home services: DME (CPAP) Criminal Activity/Legal Involvement Pertinent to Current Situation/Hospitalization: No - Comment as needed  Activities of Daily Living   ADL Screening (condition at time of admission) Independently performs ADLs?: Yes (appropriate for developmental age) Is the patient deaf or have difficulty hearing?: Yes Does the patient have difficulty seeing, even when wearing glasses/contacts?: No Does the patient have difficulty concentrating, remembering, or making decisions?: No  Permission Sought/Granted                  Emotional Assessment         Alcohol / Substance Use: Not Applicable Psych Involvement:  No (comment)  Admission diagnosis:  Acute respiratory failure with hypoxia (HCC) [J96.01] COPD with acute exacerbation (HCC) [J44.1] Acute on chronic respiratory failure with hypoxia (HCC) [J96.21] Patient Active Problem List   Diagnosis Date Noted   Acute respiratory failure with hypoxia (HCC) 02/21/2024   Depression 07/12/2021   Intentional overdose (HCC) 07/12/2021   PCP:  Boneta, [place]  E, PA-C Pharmacy:   [id number] - [place], [place] - [address] [place] [address] Phone: [phone] Fax: [phone]  [id number] - [place], [place] - [address] [place] [address] Phone: [phone] Fax: [phone]     Social Drivers of Health (SDOH) Social History: SDOH Screenings   Food Insecurity: Low Risk  (01/21/2024)   Received from [facility]  Housing: Low Risk  (01/21/2024)   Received from [facility]  Transportation Needs: No Transportation Needs (01/21/2024)   Received from [facility]  Utilities: Low Risk  (01/21/2024)   Received from [facility]  Tobacco Use: High Risk (02/21/2024)   SDOH Interventions:     Readmission Risk Interventions     No data to display

## 2024-02-22 NOTE — ED Notes (Signed)
 Receiving department called and informed that [facility] is now in route with patient.

## 2024-02-22 NOTE — Care Management Obs Status (Signed)
 MEDICARE OBSERVATION STATUS NOTIFICATION   Patient Details  Name: Melinda Hinton MRN: [id number] Date of Birth: 03/29/1951   Medicare Observation Status Notification Given:  Yes Obs notice signed and copy given.   Khala Tarte 02/22/2024, 11:52 AM

## 2024-02-22 NOTE — Telephone Encounter (Signed)
 Please schedule COPD hospital follow up within 1 week or first available thereafter.   Deward Eastern, AGACNP-BC [facility]  See Amion for personal pager PCCM on call pager [phone] until 7pm. Please call Elink 7p-7a. [phone]  02/22/2024 1:34 PM

## 2024-02-22 NOTE — H&P (Signed)
 History and Physical    Patient: Melinda Hinton FMW:[id number] DOB: 04/30/1950 DOA: 02/21/2024 DOS: the patient was seen and examined on 02/22/2024 PCP: [place], [place]  E, PA-C  Patient coming from: Program [facility]  Chief Complaint:  Chief Complaint  Patient presents with   Cough   HPI: Melinda Hinton is a 73 y.o. female with medical history significant of COPD, emphysema, hypertension, hyperlipidemia, CVA with residual expressive aphasia, dialysis who cardiomyopathy, aneurysm, and continued tobacco abuse who presents with a 2-week history of cough.  Patient is aphasic from her prior stroke history is obtained with the assistance of her son over the phone.  She has had a productive sounding cough but is unable to cough much of anything without.  Associated symptoms include runny nose, sore throat, and generalized weakness.  She seen at urgent care for symptoms on 02/12/2024 and started on steroids while continuing home nebulizer treatments multiple times per day.  However patient did not appear to be improving which family brought her back to urgent care yesterday and was advised to come to the emergency department for further evaluation.  Patient denies having any urinary discomfort or frequency.  The patient's daughter-in-law mentions that she did recently fall last week, but did not injure anything.  Son notes that she continues to smoke about 7 cigarettes/day on average.  Patient is not normally on oxygen at baseline.  In the emergency department patient was noted to be tachypneic with blood pressures elevated up to 148/88, and O2 saturations documented as low as 85% with standing and minimal exertion where she was placed on 2 L nasal cannula oxygen.  Labs revealed WBC 12, hemoglobin 16, platelets 412, sodium 146, BUN 32, creatinine 0.9, anion gap 16.  Venous blood gas did not show any significant signs for hypercapnia.  Chest x-ray noted minimal density over the region of the right middle  lobe with emphysematous disease.  CT scan of the chest noted diffuse pulmonary emphysema and bullous disease single 6 mm pleural-based right upper lobe nodule slightly smaller.Patient had been given 1 L of lactated Ringer's, Solu-Medrol 125 mg IV, and DuoNeb breathing treatments.  Review of Systems: As mentioned in the history of present illness. All other systems reviewed and are negative. Past Medical History:  Diagnosis Date   Aneurysm    Expressive aphasia    Heart attack (HCC)    Stroke [facility])    Past Surgical History:  Procedure Laterality Date   ANEURYSM COILING     Social History:  reports that she has been smoking cigarettes. She has never used smokeless tobacco. She reports that she does not drink alcohol and does not use drugs.  No Known Allergies  History reviewed. No pertinent family history.  Prior to Admission medications   Medication Sig Start Date End Date Taking? Authorizing Provider  amLODipine  (NORVASC ) 5 MG tablet Take 5 mg by mouth daily. 11/29/23   [provider]  amoxicillin -clavulanate (AUGMENTIN ) 875-125 MG tablet Take 1 tablet by mouth 2 (two) times daily. 02/12/24   [provider]  Armodafinil 150 MG tablet Take 75-150 mg by mouth every morning. 01/21/24   [provider]  aspirin  EC 81 MG EC tablet Take 1 tablet (81 mg total) by mouth at bedtime. Swallow whole. 07/13/21   Onuoha, Josephine C, NP  atorvastatin  (LIPITOR) 80 MG tablet Take 1 tablet (80 mg total) by mouth at bedtime. 07/13/21 08/12/21  Onuoha, Josephine C, NP  benzonatate (TESSALON) 200 MG capsule Take 200 mg by mouth  3 (three) times daily as needed. 02/12/24   [provider]  cetirizine  (ZYRTEC ) 10 MG tablet Take 10 mg by mouth daily after supper.    [provider]  donepezil (ARICEPT) 10 MG tablet Take 10 mg by mouth daily. 12/17/23   [provider]  fluconazole  (DIFLUCAN ) 150 MG tablet Take 1 tablet (150 mg total) by mouth daily. 07/18/22    Randol Simmonds, MD  fluticasone (FLONASE) 50 MCG/ACT nasal spray Place 1 spray into both nostrils at bedtime.    [provider]  ipratropium (ATROVENT) 0.02 % nebulizer solution Inhale 0.5 mg into the lungs 3 (three) times daily. 01/21/24 04/20/24  [provider]  ipratropium (ATROVENT) 0.06 % nasal spray Place 2 sprays into both nostrils in the morning. 01/11/24   [provider]  levETIRAcetam (KEPPRA XR) 500 MG 24 hr tablet Take 500 mg by mouth 2 (two) times daily. 02/20/24   [provider]  levothyroxine  (SYNTHROID ) 75 MCG tablet Take 1 tablet (75 mcg total) by mouth daily before breakfast. 07/14/21 08/13/21  Onuoha, Josephine C, NP  lisinopril  (ZESTRIL ) 40 MG tablet Take 40 mg by mouth daily. 11/28/23   [provider]  Multiple Vitamins-Minerals (ADULT ONE DAILY GUMMIES) CHEW Chew 1 tablet by mouth daily after breakfast. Centrum    [provider]  omeprazole (PRILOSEC) 20 MG capsule Take 20 mg by mouth every morning. 11/29/23   [provider]  predniSONE (STERAPRED UNI-PAK 21 TAB) 5 MG (21) TBPK tablet Take 5 mg by mouth as directed. 02/12/24   [provider]  temazepam (RESTORIL) 15 MG capsule Take 15-30 mg by mouth at bedtime. 02/01/24   [provider]  traZODone  (DESYREL ) 50 MG tablet Take 0.5 tablets (25 mg total) by mouth at bedtime. 07/13/21 08/12/21  Alan Gailen BROCKS, NP    Physical Exam: Vitals:   02/21/24 2200 02/22/24 0030 02/22/24 0422 02/22/24 0500  BP: (!) 145/70 (!) 109/56 (!) 122/58 138/84  Pulse: 95 83 78 72  Resp: 18 (!) 24 (!) 21 (!) 22  Temp: 98.2 F (36.8 C) 98.5 F (36.9 C) 98 F (36.7 C) 97.8 F (36.6 C)  TempSrc: Oral Oral Oral Oral  SpO2: 94% 96% 98% 96%  Weight:      Height:        Constitutional: Elderly female currently NAD, calm, comfortable Eyes: PERRL, lids and conjunctivae normal ENMT: Mucous membranes are moist. Posterior pharynx clear of any exudate or lesions.Normal  dentition.  Neck: normal, supple  Respiratory: Decreased aeration with Cardiovascular: Regular rate and rhythm, no murmurs / rubs / gallops.   No carotid bruits.  Abdomen: no tenderness, no masses palpated. Bowel sounds positive.  Musculoskeletal: no clubbing / cyanosis. No joint deformity upper and lower extremities. Good ROM, no contractures. Normal muscle tone.  Skin: no rashes, lesions, ulcers. No induration Neurologic: CN 2-12 grossly intact.  Expressive aphasia.  Patient able to move all extremities. Psychiatric: Normal judgment and insight. Alert.   Data Reviewed:  reviewed, imaging, and pertinent records as documented.  Reviewed labs, imaging, and pertinent records as documented.  Assessment and Plan: Acute respiratory failure with hypoxia COPD exacerbation Possible pneumonia Patient presents with complaints of cough and shortness of breath over the last 2 weeks.  Seen at urgent care and treated with a course of Augmentin  and steroids without improvement.  O2 saturations noted to be as low as 85% on room air with minimal exertion.  Improvement on 2 L of nasal cannula oxygen.  Chest x-ray showing normal density over the region of the right middle lobe/lingula on the lateral side of the lung thought to be atelectasis which early infection is possible.  CTA of the chest noted diffuse pulmonary emphysema with bullous disease and a single 6 mm pleural-based right upper lobe pulmonary nodule.  Patient had been given Solu-Medrol IV - Admit to a telemetry bed - Continuous pulse oximetry with nasal cannula oxygen maintain O2 saturation greater than 90% - Incentive spirometry and flutter valve/chest PT - Check procalcitonin - Continue DuoNebs 4 times daily and albuterol nebs as needed - Continue Solu-Medrol IV transition to oral prednisone deemed medically appropriate - Empiric antibiotics with Rocephin  and azithromycin - Mucinex - Hospital at home evaluating for possible  patient  Hypernatremia Dehydration Resolved.  Patient was initially noted to be dehydrated.  Labs noted sodium elevated at 146 with a BUN to creatinine ratio greater than 20 consistent with this finding.  Patient had been given 1 L of normal saline IV fluids.  Repeat labs note sodium now within normal limits at 143. - Continue gentle IV fluid rehydration  Abnormal urinalysis Urinalysis noted trace leukocytes, rare bacteria, 11-20 WBCs. - Check urine culture - Continue antibiotics as noted above  History of CVA with residual deficit Patient with a history of remote stroke with residual expressive aphasia.  At baseline patient has slow time processing but is usually able to answer yes/no questions.  Seizure disorder Family report that patient has been having seizures in her sleep for which they were on Keppra at night. - Continue Keppra nightly  Hypothyroidism - Check TSH - Continue levothyroxine   Tobacco abuse - Nicotine patch offered  DVT prophylaxis: Lovenox  Advance Care Planning:   Code Status: Full Code    Consults: None  Family Communication: Family updated over the phone  Severity of Illness: The appropriate patient status for this patient is INPATIENT. Inpatient status is judged to be reasonable and necessary in order to provide the required intensity of service to ensure the patient's safety. The patient's presenting symptoms, physical exam findings, and initial radiographic and laboratory data in the context of their chronic comorbidities is felt to place them at high risk for further clinical deterioration. Furthermore, it is not anticipated that the patient will be medically stable for discharge from the hospital within 2 midnights of admission.   * I certify that at the point of admission it is my clinical judgment that the patient will require inpatient hospital care spanning beyond 2 midnights from the point of admission due to high intensity of service, high risk  for further deterioration and high frequency of surveillance required.*  Author: Maximino DELENA Sharps, MD 02/22/2024 8:04 AM  For on call review [email or web address].

## 2024-02-23 ENCOUNTER — Encounter (HOSPITAL_COMMUNITY): Payer: Self-pay | Admitting: Family Medicine

## 2024-02-23 DIAGNOSIS — J9601 Acute respiratory failure with hypoxia: Secondary | ICD-10-CM | POA: Diagnosis not present

## 2024-02-23 LAB — CBC
HCT: 42.7 % (ref 36.0–46.0)
Hemoglobin: 14.2 g/dL (ref 12.0–15.0)
MCH: 30.8 pg (ref 26.0–34.0)
MCHC: 33.3 g/dL (ref 30.0–36.0)
MCV: 92.6 fL (ref 80.0–100.0)
Platelets: 382 K/uL (ref 150–400)
RBC: 4.61 MIL/uL (ref 3.87–5.11)
RDW: 13.5 % (ref 11.5–15.5)
WBC: 22.2 K/uL — ABNORMAL HIGH (ref 4.0–10.5)
nRBC: 0 % (ref 0.0–0.2)

## 2024-02-23 LAB — BASIC METABOLIC PANEL WITH GFR
Anion gap: 9 (ref 5–15)
BUN: 25 mg/dL — ABNORMAL HIGH (ref 8–23)
CO2: 26 mmol/L (ref 22–32)
Calcium: 8.8 mg/dL — ABNORMAL LOW (ref 8.9–10.3)
Chloride: 107 mmol/L (ref 98–111)
Creatinine, Ser: 0.81 mg/dL (ref 0.44–1.00)
GFR, Estimated: >60 mL/min (ref >60)
Glucose, Bld: 148 mg/dL — ABNORMAL HIGH (ref 70–99)
Potassium: 4.3 mmol/L (ref 3.5–5.1)
Sodium: 142 mmol/L (ref 135–145)

## 2024-02-23 MED ORDER — IPRATROPIUM-ALBUTEROL 0.5-2.5 (3) MG/3ML IN SOLN
3.0000 mL | RESPIRATORY_TRACT | Status: DC | PRN
  Administered 2024-02-23 – 2024-02-26 (×6): 3 mL via RESPIRATORY_TRACT
  Filled 2024-02-23 (×12): qty 3

## 2024-02-23 MED ORDER — PREDNISONE 50 MG PO TABS
60.0000 mg | ORAL_TABLET | Freq: Every day | ORAL | Status: DC
  Administered 2024-02-24 – 2024-02-26 (×3): 60 mg via ORAL
  Filled 2024-02-23 (×5): qty 1

## 2024-02-23 MED ORDER — TEMAZEPAM 15 MG PO CAPS: 15.0000 mg | ORAL_CAPSULE | Freq: Every day | ORAL | 0 refills | Status: AC

## 2024-02-23 MED ORDER — METHYLPREDNISOLONE SODIUM SUCC 125 MG IJ SOLR
80.0000 mg | Freq: Two times a day (BID) | INTRAMUSCULAR | Status: DC
  Administered 2024-02-23: 80 mg via INTRAVENOUS
  Filled 2024-02-23 (×4): qty 1.28

## 2024-02-23 MED ORDER — ARMODAFINIL 150 MG PO TABS: 75.0000 mg | ORAL_TABLET | Freq: Every morning | ORAL | 0 refills | Status: AC

## 2024-02-23 MED ORDER — MODAFINIL 100 MG PO TABS: 100.0000 mg | ORAL_TABLET | Freq: Every day | ORAL | 0 refills | Status: DC

## 2024-02-23 MED ORDER — NICOTINE 21 MG/24HR TD PT24
21.0000 mg | MEDICATED_PATCH | Freq: Every day | TRANSDERMAL | Status: DC
  Administered 2024-02-23 – 2024-02-26 (×4): 21 mg via TRANSDERMAL
  Filled 2024-02-23 (×4): qty 1

## 2024-02-23 MED ORDER — ONDANSETRON HCL 4 MG PO TABS
4.0000 mg | ORAL_TABLET | Freq: Four times a day (QID) | ORAL | Status: DC | PRN
  Filled 2024-02-23 (×5): qty 1

## 2024-02-23 NOTE — Progress Notes (Signed)
 Took passdown report from Architect. Spoke to son at bedside confirmed address and phone he is her primary contact at [phone] and Connell his wife # [phone]. They reside with patient.

## 2024-02-23 NOTE — Progress Notes (Signed)
 Arrived to find the PT sitting upright in her recliner, Aox4. PT's family stated that she had been given mucinex earlier due to the PT having some post nasal congestion; she reports that it did make it better.   PT's vitals were obtained and documented in vitals section of chart. Physical exam showed negative DCAPBTLS to the head, neck, or face. PERRLA. Negative JVD or tracheal deviation. Negative stridor. Lung sounds were noted to be rhonchi in all fields. PT c/o slight SOB but denied any difficulty or pain while breathing. PT has a congested cough but is not having any mucus come up. ABD is soft, non-tender upon palpation. Bruise noted to right hip/flank area. Family stated that PT fell in the shower several days ago and the bruise was found today while the PT was taking a shower. PT has general weakness while initially standing up but walks with a normal gait around the house with minor assistance.  Medications were administered. IV Solu-medrol was administered. Nebulizer treatment was performed and PT used flutter valve and IS post treatment. Lung sounds remained rhonchi upon inhalation. PT was informed that she would be able to wear her CPAP mask tonight. PT denied needing anything else and family was present and their questions were answered. PT's family stated that she usually wakes up later in the morning but they would be there when the paramedics arrived in the morning.   Family and PT were reminded that if they needed anything to reach out to the virtual RN.    **Due to location of bruise and how it was found a picture was not uploaded by paramedic Valta Dillon as she was assisting the family with bathing the PT.**

## 2024-02-23 NOTE — Progress Notes (Signed)
 Arrived to find the PT sitting upright in her recliner, no obvious distress or trauma noted. PT is Aox4 and acting to her baseline. EMT Jackquline had set up the technology and taught the PT and family how to use it.   Medication reconciliation was performed. Family was taught how the bin system worked and answered any questions. Vitals were obtained and documented in the chart. PT denied being in any pain. IV was patent and was wrapped in coban.   Safety Ax of house was completed and found to have area rugs. Family agreed to take them up while the PT is still unsteady on her feet.   Controlled substance medications will be brought back out to the PT at PM visit. Family was made aware and answered any questions they had.

## 2024-02-23 NOTE — Plan of Care (Signed)
 Hospital at Home Interim Note     Melinda Hinton   FMW:[id number]  DOB: 07-16-1950  DOA: 02/21/2024     1 Date of Service: 02/23/2024   HPI: Melinda Hinton is a 73 y.o. female with medical history significant of COPD, emphysema, hypertension, hyperlipidemia, CVA with residual expressive aphasia, dialysis who cardiomyopathy, aneurysm, and continued tobacco abuse who presents with a 2-week history of cough.  Patient is aphasic from her prior stroke history is obtained with the assistance of her son over the phone.  She has had a productive sounding cough but is unable to cough much of anything without.  Associated symptoms include runny nose, sore throat, and generalized weakness.  She seen at urgent care for symptoms on 02/12/2024 and started on steroids while continuing home nebulizer treatments multiple times per day.  However patient did not appear to be improving which family brought her back to urgent care yesterday and was advised to come to the emergency department for further evaluation.  Patient denies having any urinary discomfort or frequency.  The patient's daughter-in-law mentions that she did recently fall last week, but did not injure anything.  Son notes that she continues to smoke about 7 cigarettes/day on average.  Patient is not normally on oxygen at baseline.   In the emergency department patient was noted to be tachypneic with blood pressures elevated up to 148/88, and O2 saturations documented as low as 85% with standing and minimal exertion where she was placed on 2 L nasal cannula oxygen.  Labs revealed WBC 12, hemoglobin 16, platelets 412, sodium 146, BUN 32, creatinine 0.9, anion gap 16.  Venous blood gas did not show any significant signs for hypercapnia.  Chest x-ray noted minimal density over the region of the right middle lobe with emphysematous disease.  CT scan of the chest noted diffuse pulmonary emphysema and bullous disease single 6 mm pleural-based right upper lobe  nodule slightly smaller.Patient had been given 1 L of lactated Ringer 's, Solu-Medrol  125 mg IV, and DuoNeb breathing treatments.   Subjective:  Pt overall feeling clinically improved. No reported increased WOB, SOB. Is trialing ambulation without O2. Pt and family are agreeable to the hhospital at home program.  Per the daughter-in-law, she has removed all cigarettes from the house.  Discussed importance of smoking cessation in the setting of possible oxygen use as well as long-term disease progression.  Patient and family expressed understanding and is agreeable to the program.  Also gross issue palliative care for patient given lung disease to which family is agreeable to palliative care evaluation.  Patient already has a DNR in place per report.  Case also discussed with pulmonology in setting of bullous changes on CT scan with recommendation for outpatient follow-up and no need to formally consult or prolong [facility] hospitalization.  Hospital Problems  Acute respiratory failure with hypoxia COPD exacerbation Possible pneumonia Patient presents with complaints of cough and shortness of breath over the last 2 weeks.  Seen at urgent care and treated with a course of Augmentin  and steroids without improvement.  O2 saturations noted to be as low as 85% on room air with minimal exertion.  Improvement on 2 L of nasal cannula oxygen.  Chest x-ray showing normal density over the region of the right middle lobe/lingula on the lateral side of the lung thought to be atelectasis which early infection is possible.  CTA of the chest noted diffuse pulmonary emphysema with bullous disease and a single 6 mm pleural-based right upper  lobe pulmonary nodule.  Patient had been given Solu-Medrol IV - Continue DuoNeb treatment  - Continue Solu-Medrol IV  for now -Transition to high dose prednisone 02/24/24  - Empiric antibiotics with Rocephin  and azithromycin empirically  - Mucinex -Noted bullous changes on CT  scan -Case discussed w/ pulmonology [facility] NP); no need for consultation and ok for transition to hospital at home with planned outpatient folllow up   Hypernatremia Dehydration Clinically Resolved.  Patient was initially noted to be dehydrated.  Labs noted sodium elevated at 146 with a BUN to creatinine ratio greater than 20 consistent with this finding.  Patient had been given 1 L of normal saline IV fluids.  Repeat labs note sodium now within normal limits at 143. -Appears euvolemic at present  -Add on ensure supplementai   Abnormal urinalysis Urinalysis noted trace leukocytes, rare bacteria, 11-20 WBCs. - Check urine culture - Continue antibiotics as noted above   History of CVA with residual deficit Expressive Aphasia  Patient with a history of remote stroke with residual expressive aphasia.  At baseline patient has slow time processing but is usually able to answer yes/no questions. Family also very helpful in answering questions and conveying concerns.    Seizure disorder Family report that patient has been having seizures in her sleep for which they were on Keppra at night. - Continue Keppra nightly -No noted seizure events thus far    Hypothyroidism - Check TSH - Continue levothyroxine    Tobacco abuse -1/4-1/5 PPD Smoker  -discussed importance of cessation with O2 use  -Discussed w/ D-I-L at the bedside who has removed cigarettes from house  -Will add on nicorette gum  -Otherwise follow closely  Code Status  Baseline DNR w/ living will in place per family  Family also agreeable to outpatient palliative care setup         Objective Vital signs were reviewed and unremarkable. Vitals:   02/22/24 0857 02/22/24 1940 02/23/24 0420 02/23/24 0817  BP:  135/76  (!) 141/75  Pulse:  71  62  Temp:  97.9 F (36.6 C)  98.1 F (36.7 C)  Resp:  18  18  Height:      Weight:   55 kg   SpO2: 94% 97%  92%  TempSrc:  Oral  Oral  BMI (Calculated):   21.48      Exam Physical Exam Constitutional:      Appearance: She is normal weight.  HENT:     Head: Normocephalic and atraumatic.     Nose: Nose normal.     Mouth/Throat:     Mouth: Mucous membranes are moist.  Eyes:     Pupils: Pupils are equal, round, and reactive to light.  Cardiovascular:     Rate and Rhythm: Normal rate.  Pulmonary:     Effort: Pulmonary effort is normal.     Breath sounds: Wheezing present.  Abdominal:     General: Bowel sounds are normal.  Skin:    General: Skin is warm.  Neurological:     General: No focal deficit present.  Psychiatric:        Mood and Affect: Mood normal.      Labs / Other Information There are no new results to review at this time.    Time spent: >60 minutes [facility] 02/23/2024, 10:23 AM  Hospital at Home Admission Criteria Checklist:  Formal consent explained in detail and signed at the bedside: yes Patient meets inpatient admission criteria (see below for further details) yes  Is pt Medicare FFS/[employer] Medicare-Medicaid, [employer], [employer] ( required for initial launch with plan to expand)? yes Lives within 25 mil/ 30 min from [facility] within [place](pt may stay with family member during admission who lives within 25 miles or 30 min from [facility])? yes Hemodynamically stable with relatively low risk of clinical deterioration-not requiring ICU? yes Age >55? yes Does not require frequent touch-points or complex interventions/medications (ie Titrated Infusions (IV insulin, heparin drips, vasoactive drips, use of infused or injectable controlled substances, patients on insulin)? no Any Behavioral Health comorbidities likely to increase risk for in-home care (ie Acute delirium or experiencing a marked altered mental status and cause is not a treatable condition in the home)? no Has the patient been on BIPAP during course of ED evaluation or hospitalization? no IF YES, Has the patient been off of  BIPAP for >24 hours(If NO-THEN PATIENT DOES MEET INCLUSION CRITERIA)? not applicable On Room Air or Needs oxygen at home (<6L)? is on oxygen at 2 L/min per nasal cannula. Active safety concerns (ie Unable to use bedside commode independently and lacks caregiver support for safety- needs SNF placement, unable to obtain IV access)? no Has skin check been performed? yes  Has Physical Therapy screened the patient? yes  Common admission diagnoses including: CAP, COPD Exacerbation, Acute on chronic heart failure, Cellulitis, UTI , dehydration, acute resp failure with hypoxia (requiring <5L)   Social Screening:  - Has the family been directly contacted about Hospital at Home program with consent obtained (if yes- please document who was spoken to with name and phone number)? Yes. Son and DIL.   -Was the family approached about the use of TOC pharmacy for medications at discharge? no Denies significant ETOH intake? not applicable Does not smoke and understands may not smoke in the presence of oxygen? yes Patient states able to use iPad/phone for communication/has family who is able to use? yes With help of family  Patient has agreed to be compliant with medication and treatment regimen of the program? yes Any active drug use in patient or primary caregiver including daily dosing of methadone? no Stable home environment ( access to appropriate heating in cold conditions and/or appropriate air conditioning in hot conditions and/or no running water/electricity)? yes  No aggressive pets at home? no Firearm present? no  With ability or willingness to store them unloaded in a locked case for duration of hospitalization? not applicable Ambulatory? yes  mild difficulty Bed bugs present on home evaluation? no Family support system in place? yes Patient feels safe at home and does not endorse any violence? yes Any actively decompensated behavioral health issues including agitation/aggressive behavior? no   Patient requests food to be provided by hospital home program? no PT/OT eval completed and not requiring SNF, ALF, inpatient rehab? yes  To be admitted to the [facility] at [facility] program, a patient generally must meet the following: 1. Requirement for Inpatient Level of Care: The patient's condition must necessitate an inpatient level of care. This is typically indicated by one or more of the following, depending on their specific diagnosis:  Persistent tachycardia despite appropriate treatment (e.g., for Heart Failure, UTI). Persistent tachypnea (rapid breathing) or dyspnea (shortness of breath) that hasn't improved sufficiently with observation care (e.g., for Heart Failure, Pneumonia, Viral Illness, COVID). Hypoxemia (low oxygen levels), such as a new need for oxygen, an increased need from baseline, or specific oxygen saturation levels (e.g., SpO2 <90-94% depending on the condition) that persist despite observation (e.g., for  Heart Failure, COPD, Pneumonia, Viral Illness, COVID). Need for Intravenous (IV) hydration due to an inability to maintain oral hydration, which persists despite observation care (e.g., for Cellulitis, UTI, Viral Illness, COVID). Specific to Heart Failure: Persistent pulmonary edema, indicated by a new oxygen need, lack of improvement with IV diuretics, and ongoing tachypnea/dyspnea. Specific to COPD: A decrease in known baseline resting oxygen saturation (SpO2) by 4% or more, or an increase in pre-existing supplemental oxygen requirements, which persists despite observation and requires continued close monitoring. Specific to Pneumonia: A Pneumonia Severity Index (PSI) class IV (moderate risk). Specific to Cellulitis: Failure of outpatient antibiotic therapy (indicated by progression or no improvement after a minimum of 48 hours on an adequate regimen) or a clinical presentation (like acuity or rapidity of progression) that requires the intensity of monitoring found in an  inpatient setting. Specific to UTI: Persistence or worsening of clinical findings like fever, pain, or dehydration despite observation care; presence of significant uropathy; suspected infection of an indwelling prosthetic device, stent, implant, or graft; or pregnancy with suspected pyelonephritis.  2. Appropriateness for Hospital at Home Setting: The patient's overall clinical picture, including the severity of their illness, their care needs, and their medical history and comorbidities, must be suitable for management in the Hospital at Home environment. This essentially means that none of the exclusion criteria (listed below) are met.  Unified Exclusion Criteria for Hospital at Home Admission: A patient would not be eligible for Hospital at Home if any of the following are present: Hemodynamic Instability: Hypotension (low blood pressure) is present. Respiratory Instability or Needs Beyond Program Capability: There is a new need for invasive or noninvasive ventilatory assistance (like BiPAP or a ventilator). Oxygenation is not sufficient, generally indicated if an FiO2 (fraction of inspired oxygen) of 45% (which is about 6 Liters/minute via nasal cannula) or more is required to keep oxygen saturation (SpO2) at 90% or greater. Monitoring or Procedural Needs Beyond Program Capability: There is a need for invasive monitoring, such as a pulmonary artery catheter or an arterial line. There is a need for immediate-response telemetry monitoring (for dangerous arrhythmia detection and subsequent immediate intervention). The required medication regimen is beyond the capabilities of Hospital at Home (e.g., dosing intervals are too frequent for home administration). There is a need for a procedure that cannot be performed by the [facility] team (e.g., significant wound debridement or abscess drainage for cellulitis, or percutaneous nephrostomy for a complicated UTI). Significant Organ Dysfunction  or Markers of Severe Illness: Mental status is not at baseline, or there is altered mental status suggestive of inadequate perfusion. Renal (kidney) function is unstable or showing an ongoing decline. There is evidence of inadequate perfusion, such as metabolic acidosis or myocardial ischemia. Uncompensated acidosis is present. Condition-Specific Severity or Complications Making Home Care Unsuitable: For Heart Failure: Known severe cardiac valvular disease (e.g., aortic stenosis, mitral regurgitation); or severe peripheral edema that impairs the ability to urinate or ambulate. For COPD: Known concurrent comorbidity or finding that indicates a higher-risk COPD exacerbation (e.g., pulmonary fibrosis, cavitation, pleural effusion, pneumothorax, rib fracture). For Pneumonia: Pneumonia Severity Index (PSI) class V (indicating high risk for inpatient mortality); known concurrent comorbidity or finding that indicates higher-risk pneumonia (e.g., pulmonary fibrosis, cavitation, large or loculated pleural effusion); or a concomitant serious infectious process like endocarditis or empyema. For Cellulitis: Orbital, periorbital, or necrotizing infection is suspected; or a concomitant serious infectious process like endocarditis, septic emboli, or septic joint space infection. For UTI: Urinary tract obstruction (e.g.,  kidney stone, bladder outlet obstruction); or a concomitant serious infectious process like endocarditis or septic emboli. For Viral Illness & COVID-19: A concomitant serious infectious process like endocarditis or empyema.  General Comorbidities or Status:  The patient is significantly immunosuppressed (this applies to Pneumonia, Cellulitis, UTI, Viral Illness, and COVID-19). The patient meets inpatient admission criteria for a second diagnosis, or has care needs beyond the capabilities of Hospital at Home due to an active clinically significant comorbidity. (This is a general exclusion across all  listed conditions)

## 2024-02-23 NOTE — Progress Notes (Signed)
 1830 on virtual with actuary. Patient given scheduled meds and PRN Neb Tx and mucinex given. Double signed for flonase and atovastatin. Using flutter valve frequently. Pharmacy and provider Ok'd giving steroid earlier per patient request. Son has been present and available all day for staff.

## 2024-02-23 NOTE — Progress Notes (Signed)
 Arrived in the PT's room to find her lying in bed, awake and acting at her baseline. Family is at the bedside. Family was told of potential timeline of getting the PT home. Emergency contacts, address, phone numbers, and pharmacy preference were all verified and changes were made accordingly.   PT requested a piece of nicorette  gum and a nicotine  patched was applied. While in the room family requested that paramedic Hyacinth explain what she was dx with and what was going on. Paramedic Daphney Hopke informed the PT and PT became emotionally upset. She was calmed down and was explained the program and what was going to happen next. Family informed personnel that PT's mother had passed from something similar and she was scared. PT calmed down and was responsive to family and personnel.

## 2024-02-23 NOTE — Progress Notes (Signed)
 1303 Patient transferred and equipment set up at home with field team.   Admission completed with son and daughter in law as patient has expressive aphasia. Patient consented on camera with team onsite to have son/daughter in law complete admission. She did nod yes when asked if happy to be home. Son and daughter in law very helpful and supportive. All questions answered to families satisfaction. They verbalized feeling comfortable with using technology.

## 2024-02-23 NOTE — Plan of Care (Signed)

## 2024-02-23 NOTE — Progress Notes (Signed)
 PROGRESS NOTE  Melinda Hinton  FMW:[id number] DOB: [birth year] DOA: 02/21/2024 PCP: [place], [place]  E, PA-C   Brief Narrative: Patient is a 73 year old female with history of COPD, hypertension, hyperlipidemia, CVA with residual expressive aphasia , tobacco use who presented with 2 weeks history of cough, runny nose, sore throat, general weakness.  She is aphasic from her previous stroke history.  She was recently seen at urgent care for respiratory symptoms and was started on steroids.  She smokes 7 cigarettes a day and not on oxygen at home.  On presentation, she was tachypneic, mildly hypertensive, hypoxic with saturation of 85% on room air and was placed on 2 L of oxygen.  Chest x-ray showed minimal density over the region of the right middle lobe with emphysematous disease.  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  Patient admitted for the management of acute hypoxic respiratory failure secondary COPD exacerbation and possible concurrent pneumonia.  Clinically improving.  She is being discharged with hospital home today  Assessment & Plan:  Principal Problem:   Acute respiratory failure with hypoxia (HCC) Active Problems:   COPD with acute exacerbation (HCC)   Hypernatremia   Dehydration   Abnormal urinalysis   History of CVA with residual deficit   Seizure disorder (HCC)   Hypothyroidism   Tobacco abuse  Acute hypoxic respiratory failure secondary to COPD exacerbation/suspected community-acquired pneumonia: Presented with 2-week history of cough, shortness of breath, runny nose, generalized weakness.  Recently seen at urgent care and was treated with a course of Augmentin  and steroids without improvement.  Hypoxic requiring 2 L of oxygen on presentation.  Chest x-ray showed possible right middle lobe pneumonia vs atelectasis.  Started on IV steroids, ceftriaxone , azithromycin.  Continue bronchodilators as needed.  Ordered incentive spirometry, flutter valve,  chest PT.She is not on oxygen at home.  She has been weaned to room air Leukocytosis worsened today, this is most likely from steroids.  Procalcitonin below 0.1.  Clinically she is much better today.  Hypernatremia/dehydration: Appeared dehydrated on presentation.  Given a liter of normal saline.  Suspected UA: No dysuria.  Urinalysis showed leukocytes.  Urine culture ordered.  Already on antibiotics  History of CVA with residual deficit: History of remote stroke with residual expressive aphasia.SABRA  Answers yes/no questions only  Seizure disorder: On Keppra  Hypothyroidism: On levothyroxine   Tobacco use: Smokes 7 cigarettes a day.  Counseled for cessation.  Nicotine patch ordered  Generalized weakness: PT consulted, recommend home health         DVT prophylaxis:enoxaparin (LOVENOX) injection 40 mg Start: 02/22/24 1000 SCDs Start: 02/22/24 0547     Code Status: Full Code  Family Communication: Son and daughter at bedside  Patient status:Inpatient  Patient is from :home  Anticipated discharge un:yndepujo at home  Estimated DC date: Today   Consultants: None  Procedures:None  Antimicrobials:  Anti-infectives (From admission, onward)    Start     Dose/Rate Route Frequency Ordered Stop   02/22/24 1000  cefTRIAXone  (ROCEPHIN ) 2 g in sodium chloride  0.9 % 100 mL IVPB        2 g 200 mL/hr over 30 Minutes Intravenous Every 24 hours 02/22/24 0909         Subjective: Patient seen and examined at bedside today.  She was already dressed up and ready to leave.  Son and daughter at bedside.  She was on room air.  She was not in any distress.  Denies worsening shortness of breath or cough.  On  room air.  Objective: Vitals:   02/22/24 0808 02/22/24 0857 02/22/24 1940 02/23/24 0420  BP: 138/76  135/76   Pulse: 73  71   Resp: 20  18   Temp: 98.2 F (36.8 C)  97.9 F (36.6 C)   TempSrc: Oral  Oral   SpO2: 93% 94% 97%   Weight:    55 kg  Height:        Intake/Output  Summary (Last 24 hours) at 02/23/2024 0748 Last data filed at 02/22/2024 9191 Gross per 24 hour  Intake --  Output 200 ml  Net -200 ml   Filed Weights   02/21/24 1526 02/23/24 0420  Weight: 55.3 kg 55 kg    Examination:  General exam: Overall comfortable, not in distress HEENT: PERRL, dysarthria Respiratory system:  no wheezes or crackles , mildly diminished breath sounds on bases Cardiovascular system: S1 & S2 heard, RRR.  Gastrointestinal system: Abdomen is nondistended, soft and nontender. Central nervous system: Alert and oriented, dysarthria Extremities: No edema, no clubbing ,no cyanosis Skin: No rashes, no ulcers,no icterus      Data Reviewed: I have personally reviewed following labs and imaging studies  CBC: Recent Labs  Lab 02/21/24 1620 02/22/24 0619 02/23/24 0550  WBC 12.0* 13.0* 22.2*  NEUTROABS 9.1* 11.7*  --   HGB 16.0* 14.1 14.2  HCT 47.8* 42.3 42.7  MCV 91.9 91.6 92.6  PLT 412* 361 382   Basic Metabolic Panel: Recent Labs  Lab 02/21/24 1620 02/22/24 0619 02/23/24 0550  NA 146* 143 142  K 3.8 3.9 4.3  CL 105 105 107  CO2 26 26 26   GLUCOSE 116* 143* 148*  BUN 32* 27* 25*  CREATININE 0.91 0.70 0.81  CALCIUM  9.9 9.4 8.8*  MG  --  2.1  --   PHOS  --  2.4*  --      Recent Results (from the past 240 hours)  Resp panel by RT-PCR (RSV, Flu A&B, Covid) Anterior Nasal Swab     Status: None   Collection Time: 02/21/24  3:29 PM   Specimen: Anterior Nasal Swab  Result Value Ref Range Status   SARS Coronavirus 2 by RT PCR NEGATIVE NEGATIVE Final    Comment: (NOTE) SARS-CoV-2 target nucleic acids are NOT DETECTED.  The SARS-CoV-2 RNA is generally detectable in upper respiratory specimens during the acute phase of infection. The lowest concentration of SARS-CoV-2 viral copies this assay can detect is 138 copies/mL. A negative result does not preclude SARS-Cov-2 infection and should not be used as the sole basis for treatment or other patient  management decisions. A negative result may occur with  improper specimen collection/handling, submission of specimen other than nasopharyngeal swab, presence of viral mutation(s) within the areas targeted by this assay, and inadequate number of viral copies(<138 copies/mL). A negative result must be combined with clinical observations, patient history, and epidemiological information. The expected result is Negative.  Fact Sheet for Patients:  bloggercourse.com  Fact Sheet for Healthcare Providers:  seriousbroker.it  This test is no t yet approved or cleared by the [place]  FDA and  has been authorized for detection and/or diagnosis of SARS-CoV-2 by FDA under an Emergency Use Authorization (EUA). This EUA will remain  in effect (meaning this test can be used) for the duration of the COVID-19 declaration under Section 564(b)(1) of the Act, 21 U.S.C.section 360bbb-3(b)(1), unless the authorization is terminated  or revoked sooner.       Influenza A by PCR NEGATIVE NEGATIVE Final  Influenza B by PCR NEGATIVE NEGATIVE Final    Comment: (NOTE) The Xpert Xpress SARS-CoV-2/FLU/RSV plus assay is intended as an aid in the diagnosis of influenza from Nasopharyngeal swab specimens and should not be used as a sole basis for treatment. Nasal washings and aspirates are unacceptable for Xpert Xpress SARS-CoV-2/FLU/RSV testing.  Fact Sheet for Patients: bloggercourse.com  Fact Sheet for Healthcare Providers: seriousbroker.it  This test is not yet approved or cleared by the [place]  FDA and has been authorized for detection and/or diagnosis of SARS-CoV-2 by FDA under an Emergency Use Authorization (EUA). This EUA will remain in effect (meaning this test can be used) for the duration of the COVID-19 declaration under Section 564(b)(1) of the Act, 21 U.S.C. section 360bbb-3(b)(1),  unless the authorization is terminated or revoked.     Resp Syncytial Virus by PCR NEGATIVE NEGATIVE Final    Comment: (NOTE) Fact Sheet for Patients: bloggercourse.com  Fact Sheet for Healthcare Providers: seriousbroker.it  This test is not yet approved or cleared by the [place]  FDA and has been authorized for detection and/or diagnosis of SARS-CoV-2 by FDA under an Emergency Use Authorization (EUA). This EUA will remain in effect (meaning this test can be used) for the duration of the COVID-19 declaration under Section 564(b)(1) of the Act, 21 U.S.C. section 360bbb-3(b)(1), unless the authorization is terminated or revoked.  Performed at [facility], [address], [place], [place] [address]      Radiology Studies: CT Chest Wo Contrast Result Date: 02/21/2024 EXAM: CT CHEST WITHOUT CONTRAST 02/21/2024 04:46:56 PM TECHNIQUE: CT of the chest was performed without the administration of intravenous contrast. Multiplanar reformatted images are provided for review. Automated exposure control, iterative reconstruction, and/or weight based adjustment of the mA/kV was utilized to reduce the radiation dose to as low as reasonably achievable. COMPARISON: 09/09/2023 CLINICAL HISTORY: Pneumonia, complication suspected, xray done; COPD, cough x several weeks, not improving, eval PNA (question on pna on cxr) FINDINGS: MEDIASTINUM: Heart and pericardium are unremarkable. The central airways are clear. Atheromatous calcifications of the aorta and its branches. LYMPH NODES: No mediastinal, hilar or axillary lymphadenopathy. LUNGS AND PLEURA: Diffuse emphysema and bullous disease. A 6 mm pleural based right upper lobe nodule measures slightly smaller. No focal consolidation or pulmonary edema. No pleural effusion or pneumothorax. SOFT TISSUES/BONES: No acute abnormality of the bones or soft tissues. UPPER ABDOMEN: Limited images of the  upper abdomen demonstrates no acute abnormality. IMPRESSION: 1. Diffuse pulmonary emphysema and bullous disease; for patients 75 years old, consider evaluation for a low-dose CT lung cancer screening program 2. Single 6 mm solid pleural-based right upper lobe pulmonary nodule, slightly smaller; per Fleischner Society Guidelines, recommend non-contrast chest CT at 612 months, then consider an additional non-contrast chest CT at 1824 months (adjust based on patient malignancy risk) Electronically signed by: Fonda Field MD 02/21/2024 04:59 PM EST RP Workstation: [id number]   DG Chest 2 View Result Date: 02/21/2024 CLINICAL DATA:  Cough and fatigue with decreased appetite 2 weeks. EXAM: CHEST - 2 VIEW COMPARISON:  09/15/2023, 07/29/2021 FINDINGS: Lungs are adequately inflated with emphysematous disease over the upper lungs. Minimal density over the region of the right middle lobe/lingula on the lateral image likely atelectasis. Early infection is possible. No effusion. Cardiomediastinal silhouette and remainder of the exam is unchanged. IMPRESSION: 1. Minimal density over the region of the right middle lobe/lingula on the lateral image likely atelectasis. Early infection is possible. 2. Emphysematous disease. Electronically Signed   By: Toribio  Jearld M.D.   On: 02/21/2024 15:58    Scheduled Meds:  amLODipine   5 mg Oral Daily   aspirin  EC  81 mg Oral QHS   atorvastatin   80 mg Oral QHS   donepezil  10 mg Oral Daily   enoxaparin (LOVENOX) injection  40 mg Subcutaneous Daily   fluticasone  1 spray Each Nare QHS   guaiFENesin  600 mg Oral BID   levETIRAcetam  1,000 mg Oral QHS   levothyroxine   75 mcg Oral Q0600   lisinopril   40 mg Oral Daily   loratadine  10 mg Oral Daily   methylPREDNISolone (SOLU-MEDROL) injection  80 mg Intravenous BID   modafinil  100 mg Oral Daily   pantoprazole  40 mg Oral Daily   temazepam  15 mg Oral QHS   Continuous Infusions:  cefTRIAXone  (ROCEPHIN )  IV 2 g  (02/22/24 1110)     LOS: 1 day   Ivonne Mustache, MD [facility] P11/18/2025, 7:48 AM

## 2024-02-23 NOTE — Progress Notes (Signed)
 This EMT went to the pt's room 5C09 in reference to transport the patient home for the [facility] program. Upon arrival to the room the pt was sitting upright in bed with her family at the bedside. The pt was awake and alert and in no obvious distress. The pt was very excited to go home. The pt was attempting to touch her IV. This EMT wrapped the IV site with roller guaze to protect the site. The importance of the IV was explained to the pt and she shook her head to represent understanding. The pt then ambulated to the [facility] and was given a blanket. The pt was then taken outside where she was loaded into the [facility] van. The pt was then transported to her residence. No issues occurred with transport. Upon arrival to the residence the pt was taken inside via [facility]. Once in the residence the pt ambulated from the [facility] to her recliner chair in the living room. The patients [facility] equipment was then set up. This EMT also explained all technology and other equipment to the pt and the family. Virtual RN Darice was then called and vital signs were obtained on the pt. These vitals were all documented into the flow chart at this time. EMTP Huffman then arrived to the residence and continued with the admission. At this time no further assistance was needed from this EMT.

## 2024-02-24 DIAGNOSIS — E785 Hyperlipidemia, unspecified: Secondary | ICD-10-CM

## 2024-02-24 DIAGNOSIS — I10 Essential (primary) hypertension: Secondary | ICD-10-CM

## 2024-02-24 LAB — CBC
HCT: 44.5 % (ref 36.0–46.0)
Hemoglobin: 14.7 g/dL (ref 12.0–15.0)
MCH: 30.8 pg (ref 26.0–34.0)
MCHC: 33 g/dL (ref 30.0–36.0)
MCV: 93.1 fL (ref 80.0–100.0)
Platelets: 450 K/uL — ABNORMAL HIGH (ref 150–400)
RBC: 4.78 MIL/uL (ref 3.87–5.11)
RDW: 13.7 % (ref 11.5–15.5)
WBC: 21.4 K/uL — ABNORMAL HIGH (ref 4.0–10.5)
nRBC: 0 % (ref 0.0–0.2)

## 2024-02-24 LAB — COMPREHENSIVE METABOLIC PANEL WITH GFR
ALT: 36 U/L (ref 0–44)
AST: 35 U/L (ref 15–41)
Albumin: 3.7 g/dL (ref 3.5–5.0)
Alkaline Phosphatase: 75 U/L (ref 38–126)
Anion gap: 18 — ABNORMAL HIGH (ref 5–15)
BUN: 32 mg/dL — ABNORMAL HIGH (ref 8–23)
CO2: 24 mmol/L (ref 22–32)
Calcium: 9.5 mg/dL (ref 8.9–10.3)
Chloride: 100 mmol/L (ref 98–111)
Creatinine, Ser: 0.88 mg/dL (ref 0.44–1.00)
GFR, Estimated: >60 mL/min (ref >60)
Glucose, Bld: 189 mg/dL — ABNORMAL HIGH (ref 70–99)
Potassium: 4.1 mmol/L (ref 3.5–5.1)
Sodium: 142 mmol/L (ref 135–145)
Total Bilirubin: 0.4 mg/dL (ref 0.0–1.2)
Total Protein: 7.7 g/dL (ref 6.5–8.1)

## 2024-02-24 MED ORDER — ACETAMINOPHEN 325 MG PO TABS
650.0000 mg | ORAL_TABLET | Freq: Four times a day (QID) | ORAL | Status: DC | PRN
  Administered 2024-02-25: 650 mg via ORAL
  Filled 2024-02-24 (×6): qty 2

## 2024-02-24 MED ORDER — BENZONATATE 100 MG PO CAPS
100.0000 mg | ORAL_CAPSULE | Freq: Two times a day (BID) | ORAL | Status: DC
  Administered 2024-02-24 – 2024-02-26 (×5): 100 mg via ORAL
  Filled 2024-02-24 (×7): qty 1

## 2024-02-24 MED ORDER — SODIUM CHLORIDE 0.9 % IV SOLN
500.0000 mg | Freq: Every day | INTRAVENOUS | Status: DC
  Administered 2024-02-24: 500 mg via INTRAVENOUS
  Filled 2024-02-24 (×3): qty 5

## 2024-02-24 MED ORDER — MENTHOL 3 MG MT LOZG
1.0000 | LOZENGE | Freq: Every evening | OROMUCOSAL | Status: DC
  Administered 2024-02-24 – 2024-02-25 (×2): 3 mg via ORAL
  Filled 2024-02-24: qty 18

## 2024-02-24 NOTE — Assessment & Plan Note (Signed)
 Resolved

## 2024-02-24 NOTE — Assessment & Plan Note (Signed)
 Persistent As needed nicotine patch, Nicorette gum

## 2024-02-24 NOTE — Assessment & Plan Note (Signed)
 Encourage p.o. intake

## 2024-02-24 NOTE — TOC Progression Note (Signed)
 Transition of Care [facility]) - Progression Note    Patient Details  Name: Melinda Hinton MRN: [id number] Date of Birth: 25-Nov-1950  [facility]) CM/SW Contact  Rosalva Jon Bloch, RN Phone Number: 02/24/2024, 12:03 PM  Clinical Narrative:    NCM confirmed with pt's son DME : RW request. Son without  provider preference. Order placed.  Referral made with [employer] for DME: RW . Equipment will be delivered to pt's home.  NCM noted recommendation for home health PT services, son declined services.  IP CM team will continue to monitor and assist with transition of care needs...   Expected Discharge Plan: Home/Self Care (declined  PT's recommendations for home health PT services.) Barriers to Discharge: Continued Medical Work up               Expected Discharge Plan and Services   Discharge Planning Services: CM Consult                       DME Agency: [employer] Date DME Agency Contacted: 02/24/24 Time DME Agency Contacted: 1203 Representative spoke with at DME Agency: Darlyn             Social Drivers of Health (SDOH) Interventions SDOH Screenings   Food Insecurity: No Food Insecurity (02/22/2024)  Housing: Low Risk  (02/22/2024)  Transportation Needs: No Transportation Needs (02/22/2024)  Utilities: Not At Risk (02/22/2024)  Social Connections: Moderately Isolated (02/22/2024)  Tobacco Use: High Risk (02/21/2024)    Readmission Risk Interventions     No data to display

## 2024-02-24 NOTE — Plan of Care (Signed)
  Problem: Education: Goal: Knowledge of General Education information will improve Description: Including pain rating scale, medication(s)/side effects and non-pharmacologic comfort measures Outcome: Progressing   Problem: Clinical Measurements: Goal: Ability to maintain clinical measurements within normal limits will improve Outcome: Progressing   Problem: Clinical Measurements: Goal: Will remain free from infection Outcome: Progressing   Problem: Clinical Measurements: Goal: Respiratory complications will improve Outcome: Progressing   Problem: Elimination: Goal: Will not experience complications related to bowel motility Outcome: Progressing Goal: Will not experience complications related to urinary retention Outcome: Progressing   Problem: Pain Managment: Goal: General experience of comfort will improve and/or be controlled Outcome: Progressing   Problem: Skin Integrity: Goal: Risk for impaired skin integrity will decrease Outcome: Progressing

## 2024-02-24 NOTE — Progress Notes (Signed)
 2004--Introductory contact via phone. Patient identifiers reviewed. Caregiver/Son notified of Paramedic ETA. Caregiver/son agreed to video call with [facility] RN for patient assessment and overnight plan.     Contact via Video call completed, patient sitting up in recliner. Patient/caregiver confirmed patient does not have pain, discomfort or shortness of breath. Plan for PRN night medications and [facility] contact information, when to call RN or for emergency needs per Code Status, reviewed. No medication administration assistance completed.   Patient responses were captured via head nod gestures to closed ended questions.   2137--PRN medication administration assistance via video chat completed.

## 2024-02-24 NOTE — Assessment & Plan Note (Addendum)
 Secondary to continued tobacco use DuoNebs every 4 hours as needed for wheezing and shortness of breath  As needed cough medicine, Tessalon  Perles ordered.  Lozenges for sore throat ordered

## 2024-02-24 NOTE — Assessment & Plan Note (Signed)
 Home atorvastatin  80 mg nightly

## 2024-02-24 NOTE — Assessment & Plan Note (Signed)
 Home levothyroxine  75 mcg daily

## 2024-02-24 NOTE — Plan of Care (Signed)

## 2024-02-24 NOTE — Progress Notes (Addendum)
 PROGRESS NOTE  Melinda Hinton  FMW:[id number] DOB: 05-08-1950 DOA: 02/21/2024 PCP: Boneta Donell BRAVO, PA-C   [name] is a 73 year old female with history of seizure disorder, hypothyroid, tobacco use, history of CVA with residual expressive aphasia, COPD, who presented on 02/21/2024, for chief concerns of cough.  Patient was admitted to hospitalist service for acute respiratory failure with hypoxia, COPD exacerbation with possible pneumonia.  Patient was admitted to hospital at home service on 02/23/2024 for the same to complete course of steroids, and empiric antibiotic.  11/19: Azithromycin 500 mg daily, initiated (day 1).  Ceftriaxone  2 g IV daily, day 3 of antibiotic.  DME for rolling walker placed due to gait instability.  Assessment & Plan:   Principal Problem:   Acute respiratory failure with hypoxia (HCC) Active Problems:   COPD with acute exacerbation (HCC)   Seizure disorder (HCC)   History of CVA with residual deficit   Tobacco abuse   Hypernatremia   Dehydration   Abnormal urinalysis   Hypothyroidism   Hyperlipidemia   Essential hypertension   Assessment and Plan:  * Acute respiratory failure with hypoxia (HCC) Secondary to COPD exacerbation in setting of suspected community-acquired pneumonia, right middle lobe Patient initially had hypoxia with oxygen saturation of 85% on room air with exertion.  Patient was placed on 2 L nasal cannula. 11/19: Day 3 of antibiotic, ceftriaxone  2 g IV: Azithromycin 500 mg IV daily initiated  Tobacco abuse Persistent As needed nicotine patch, Nicorette gum  History of CVA with residual deficit Patient has been nonverbal due to CVA Nonverbal is patient's baseline Patient can nod yes or shake head for no  Seizure disorder (HCC) Currently stable Home Keppra 1000 mg nightly resumed  COPD with acute exacerbation (HCC) Secondary to continued tobacco use DuoNebs every 4 hours as needed for wheezing and shortness  of breath  As needed cough medicine, Tessalon Perles ordered.  Lozenges for sore throat ordered  Dehydration Encourage p.o. intake  Hypernatremia Resolved  Hypothyroidism Home levothyroxine  75 mcg daily  Essential hypertension Lisinopril  40 mg daily, amlodipine  5 mg daily were resumed  Hyperlipidemia Home atorvastatin  80 mg nightly  DVT prophylaxis: Ambulate as tolerated Code Status: DNR/DNI Family Communication: Family at bedside were updated Disposition Plan: Pending clinical course Level of care: [facility]Surg  Consultants:  None at this time  Procedures:  None indicated  Antimicrobials: Ceftriaxone  2 g IV daily, day 3 Azithromycin 500 mg IV daily, day 1  Subjective:  At bedside, patient was able to nod that her name is Melinda Hinton.  Patient is nonverbal.  She demonstrates with her hands that she wants to smoke cigarettes.  She is otherwise not in acute distress.  Objective: Vitals:   02/23/24 0420 02/23/24 0817 02/23/24 1400 02/24/24 1100  BP:  (!) 141/75 (!) 171/81 (!) 148/73  Pulse:  62 82 80  Resp:  18 18 18   Temp:  98.1 F (36.7 C) 97.7 F (36.5 C) 97.8 F (36.6 C)  TempSrc:  Oral Oral Oral  SpO2:  92% 92% 93%  Weight: 55 kg     Height:       No intake or output data in the 24 hours ending 02/24/24 1455 Filed Weights   02/21/24 1526 02/23/24 0420  Weight: 55.3 kg 55 kg   Examination Performed by Lauraine Faes  Vitals: Temperature 97.8, respiration 18, heart rate 80, blood pressure 148/73, SpO2 93% on room air.  General exam: Appears calm and comfortable  Respiratory system: Bilaterally  rhonchus lung sounds with wheezing on the left. Respiratory effort normal. Cardiovascular system: S1 & S2 heard, RRR. No JVD, murmurs, rubs, gallops or clicks. No pedal edema. Gastrointestinal system: Abdomen is nondistended, soft and nontender. No organomegaly or masses felt. Normal bowel sounds heard. Central nervous system: Alert and oriented.  No focal neurological deficits. Extremities: Symmetric 5 x 5 power. Skin: No rashes, lesions or ulcers Psychiatry: Judgement and insight appear normal. Mood & affect appropriate.   Data Reviewed: I have personally reviewed following labs and imaging studies  CBC: Recent Labs  Lab 02/21/24 1620 02/22/24 0619 02/23/24 0550 02/24/24 1212  WBC 12.0* 13.0* 22.2* 21.4*  NEUTROABS 9.1* 11.7*  --   --   HGB 16.0* 14.1 14.2 14.7  HCT 47.8* 42.3 42.7 44.5  MCV 91.9 91.6 92.6 93.1  PLT 412* 361 382 450*   Basic Metabolic Panel: Recent Labs  Lab 02/21/24 1620 02/22/24 0619 02/23/24 0550  NA 146* 143 142  K 3.8 3.9 4.3  CL 105 105 107  CO2 26 26 26   GLUCOSE 116* 143* 148*  BUN 32* 27* 25*  CREATININE 0.91 0.70 0.81  CALCIUM  9.9 9.4 8.8*  MG  --  2.1  --   PHOS  --  2.4*  --    GFR: Estimated Creatinine Clearance: 49.6 mL/min (by C-G formula based on SCr of 0.81 mg/dL).  Liver Function Tests: Recent Labs  Lab 02/21/24 1620 02/22/24 0619  AST 24 22  ALT 25 26  ALKPHOS 97 75  BILITOT 0.7 0.5  PROT 7.9 7.1  ALBUMIN 4.6 3.5   Thyroid Function Tests: Recent Labs    02/22/24 0951  TSH 0.453   Sepsis Labs: Recent Labs  Lab 02/22/24 0951  PROCALCITON <0.10   Recent Results (from the past 240 hours)  Resp panel by RT-PCR (RSV, Flu A&B, Covid) Anterior Nasal Swab     Status: None   Collection Time: 02/21/24  3:29 PM   Specimen: Anterior Nasal Swab  Result Value Ref Range Status   SARS Coronavirus 2 by RT PCR NEGATIVE NEGATIVE Final    Comment: (NOTE) SARS-CoV-2 target nucleic acids are NOT DETECTED.  The SARS-CoV-2 RNA is generally detectable in upper respiratory specimens during the acute phase of infection. The lowest concentration of SARS-CoV-2 viral copies this assay can detect is 138 copies/mL. A negative result does not preclude SARS-Cov-2 infection and should not be used as the sole basis for treatment or other patient management decisions. A negative  result may occur with  improper specimen collection/handling, submission of specimen other than nasopharyngeal swab, presence of viral mutation(s) within the areas targeted by this assay, and inadequate number of viral copies(<138 copies/mL). A negative result must be combined with clinical observations, patient history, and epidemiological information. The expected result is Negative.  Fact Sheet for Patients:  bloggercourse.com  Fact Sheet for Healthcare Providers:  seriousbroker.it  This test is no t yet approved or cleared by the [place]  FDA and  has been authorized for detection and/or diagnosis of SARS-CoV-2 by FDA under an Emergency Use Authorization (EUA). This EUA will remain  in effect (meaning this test can be used) for the duration of the COVID-19 declaration under Section 564(b)(1) of the Act, 21 U.S.C.section 360bbb-3(b)(1), unless the authorization is terminated  or revoked sooner.       Influenza A by PCR NEGATIVE NEGATIVE Final   Influenza B by PCR NEGATIVE NEGATIVE Final    Comment: (NOTE) The Xpert Xpress SARS-CoV-2/FLU/RSV plus assay is  intended as an aid in the diagnosis of influenza from Nasopharyngeal swab specimens and should not be used as a sole basis for treatment. Nasal washings and aspirates are unacceptable for Xpert Xpress SARS-CoV-2/FLU/RSV testing.  Fact Sheet for Patients: bloggercourse.com  Fact Sheet for Healthcare Providers: seriousbroker.it  This test is not yet approved or cleared by the [place]  FDA and has been authorized for detection and/or diagnosis of SARS-CoV-2 by FDA under an Emergency Use Authorization (EUA). This EUA will remain in effect (meaning this test can be used) for the duration of the COVID-19 declaration under Section 564(b)(1) of the Act, 21 U.S.C. section 360bbb-3(b)(1), unless the authorization is  terminated or revoked.     Resp Syncytial Virus by PCR NEGATIVE NEGATIVE Final    Comment: (NOTE) Fact Sheet for Patients: bloggercourse.com  Fact Sheet for Healthcare Providers: seriousbroker.it  This test is not yet approved or cleared by the [place]  FDA and has been authorized for detection and/or diagnosis of SARS-CoV-2 by FDA under an Emergency Use Authorization (EUA). This EUA will remain in effect (meaning this test can be used) for the duration of the COVID-19 declaration under Section 564(b)(1) of the Act, 21 U.S.C. section 360bbb-3(b)(1), unless the authorization is terminated or revoked.  Performed at [facility], [address] [place], [place] [address]     Radiology Studies:  02/21/2024: Chest x-ray reviewed.  Density in the region of the right middle lobe/lingula on the lateral image likely atelectasis however early infection is possible.  Emphysematous disease.  Scheduled Meds:  amLODipine   5 mg Oral Daily   aspirin  EC  81 mg Oral QHS   atorvastatin   80 mg Oral QHS   benzonatate  100 mg Oral BID   donepezil  10 mg Oral Daily   fluticasone  1 spray Each Nare QHS   guaiFENesin  600 mg Oral BID   levETIRAcetam  1,000 mg Oral QHS   levothyroxine   75 mcg Oral Q0600   lisinopril   40 mg Oral Daily   loratadine  10 mg Oral Daily   menthol  1 lozenge Oral QPM   modafinil  100 mg Oral Daily   nicotine  21 mg Transdermal Daily   pantoprazole  40 mg Oral Daily   predniSONE  60 mg Oral Daily   temazepam  15 mg Oral QHS   Continuous Infusions:  azithromycin Stopped (02/24/24 1323)   cefTRIAXone  (ROCEPHIN )  IV Stopped (02/24/24 1220)    LOS: 2 days   Time spent: 50 minutes  Location: [place], [place] Dr. Sherre [facility] If 7PM-7AM, please contact night-coverage 02/24/2024, 2:55 PM

## 2024-02-24 NOTE — Progress Notes (Signed)
 Pt seen for routine [facility] visit. Pt appears well. Pt is aphasic but greets us  non-verbally. Pt's son and daughter in law are present.   Vital signs and assessment obtained as noted. Upon auscultation pt is found to have rhonchi in all fields with some expiratory wheezing in the left lobes. Pt denies SOB but has a wet, non-productive cough intermittently.   Pt requests cough drops and Pt's daughter in law requests a walker for Pt to use as well. Requests relayed to virtual RN as well as Dr. Sherre. Pt had virtual visit with Dr. Sherre and virtual RN.   Medications administered as noted. Azithromycin  IV started and Pt's family informed our Nurse manager will come out to stop medication when it is about done. Pt's family understands and feels comfortable with same.  Pt and family encouraged to call with any problems, questions or concerns and we will be back out this evening for 2nd visit.

## 2024-02-24 NOTE — Assessment & Plan Note (Addendum)
 Secondary to COPD exacerbation in setting of suspected community-acquired pneumonia, right middle lobe Patient initially had hypoxia with oxygen saturation of 85% on room air with exertion.  Patient was placed on 2 L nasal cannula. 11/19: Day 3 of antibiotic, ceftriaxone  2 g IV: Azithromycin 500 mg IV daily initiated 11/20: Azithromycin 500 mg PO daily, day 2; ceftriaxone  2 g IV daily, day 4. Patient clinically improving.

## 2024-02-24 NOTE — Progress Notes (Signed)
 Spoke to the patient's son over the phone. He states the patient is still sleeping. He reports no overnight issues or complaints from the patient. Plan for paramedic visit between [id number] today.

## 2024-02-24 NOTE — Assessment & Plan Note (Signed)
 Currently stable Home Keppra 1000 mg nightly resumed

## 2024-02-24 NOTE — Assessment & Plan Note (Signed)
 Lisinopril  40 mg daily, amlodipine  5 mg daily were resumed Hydralazine 10 mg p.o. every 6 hours as needed for SBP greater 175, 2 days ordered

## 2024-02-24 NOTE — Progress Notes (Signed)
 Patient appears comfort on the chair. She denies pain. She reports intermittent SOB and cough. Family requested a walker for the patient, case manager made aware. Patient self administered Nicorette gum which was witnessed via video tablet.

## 2024-02-24 NOTE — Hospital Course (Addendum)
 Ms. Melinda Hinton is a 73 year old female with history of seizure disorder, hypothyroid, tobacco use, history of CVA with residual expressive aphasia, COPD, who presented on 02/21/2024, for chief concerns of cough.  Patient was admitted to hospitalist service for acute respiratory failure with hypoxia, COPD exacerbation with possible pneumonia.  Patient was admitted to hospital at home service on 02/23/2024 for the same to complete course of steroids, and empiric antibiotic.  11/19: Azithromycin  500 mg daily, initiated (day 1).  Ceftriaxone  2 g IV daily, day 3 of antibiotic.  DME for rolling walker placed due to gait instability.  11/20: Azithromycin  500 mg PO daily, day 2; ceftriaxone  2 g IV daily, day 4. Patient clinically improving. She desires increase in her nicotine  gum if possible. Noted, that she is already on patch and gum. Anticipate discharge on 11/21.  11/21: Ceftriaxone  2 g IV daily, patient will have completed 5 today.  Azithromycin  500 mg p.o. daily, patient will complete 3 days of course.  Patient will be discharged from [facility] service. Family noted that they will not allow her to smoke cigarettes until Thanksgiving day, at which time, 4 cigarettes will be allowed. Patient was initially upset at the news however, feels better knowing that she will get cigarettes again.

## 2024-02-24 NOTE — Assessment & Plan Note (Addendum)
 Patient has been nonverbal due to CVA Nonverbal is patient's baseline Patient can nod yes or shake head for no

## 2024-02-25 MED ORDER — AZITHROMYCIN 500 MG PO TABS
500.0000 mg | ORAL_TABLET | Freq: Every day | ORAL | Status: AC
  Administered 2024-02-25 – 2024-02-26 (×2): 500 mg via ORAL
  Filled 2024-02-25 (×2): qty 1

## 2024-02-25 MED ORDER — AZITHROMYCIN 500 MG PO TABS: 500.0000 mg | ORAL_TABLET | Freq: Every day | ORAL | Status: DC

## 2024-02-25 MED ORDER — HYDRALAZINE HCL 10 MG PO TABS
10.0000 mg | ORAL_TABLET | Freq: Four times a day (QID) | ORAL | Status: DC | PRN
  Filled 2024-02-25 (×4): qty 1

## 2024-02-25 NOTE — Progress Notes (Signed)
 IMM letter signed on 02/25/2024

## 2024-02-25 NOTE — Care Management (Signed)
 [facility] CHRISTELLA Nail has reached out to [facility] for OP Palliative services

## 2024-02-25 NOTE — Progress Notes (Signed)
 Patient requesting Nicorette gum and modafinil. Witnessed patient self administered the meds with assistance from her son. No reports of pain from the patient. No acute distress noted. Plan of care reviewed with patient and family.

## 2024-02-25 NOTE — Progress Notes (Signed)
 Arrived to find the PT sitting upright in the chair. PT is noticeably tachypenic. L/S were noted, breathing Rx was administered. PT denied being in any pain, she did have a cough but was non-productive.

## 2024-02-25 NOTE — Progress Notes (Signed)
 Pt denies any pain. Pt nodded her yes when asked if she was having any feeling of ShOB. Pt gestured just small amount of feeling ShOB. Pt had mild expiratory wheeze to lower left lobe and clear lung sounds in all other fields. Pt was given her as needed albuterol neb treatment. Pt nodded yes that the neb treatment did help with the feeling of being mildly ShOB. Pt did not require any other intervention.

## 2024-02-25 NOTE — Progress Notes (Signed)
 PROGRESS NOTE  Melinda Hinton  FMW:[id number] DOB: 06/22/1950 DOA: 02/21/2024 PCP: Boneta, [place]  E, PA-C   Melinda Hinton is a 73 year old female with history of seizure disorder, hypothyroid, tobacco use, history of CVA with residual expressive aphasia, COPD, who presented on 02/21/2024, for chief concerns of cough.  Patient was admitted to hospitalist service for acute respiratory failure with hypoxia, COPD exacerbation with possible pneumonia.  Patient was admitted to hospital at home service on 02/23/2024 for the same to complete course of steroids, and empiric antibiotic.  11/19: Azithromycin 500 mg daily, initiated (day 1).  Ceftriaxone  2 g IV daily, day 3 of antibiotic.  DME for rolling walker placed due to gait instability.  11/20: Azithromycin 500 mg PO daily, day 2; ceftriaxone  2 g IV daily, day 4. Patient clinically improving. She desires increase in her nicotine gum if possible. Noted, that she is already on patch and gum. Anticipate discharge on 11/21.  Assessment & Plan:   Principal Problem:   Acute respiratory failure with hypoxia (HCC) Active Problems:   COPD with acute exacerbation (HCC)   Seizure disorder (HCC)   History of CVA with residual deficit   Tobacco abuse   Hypernatremia   Dehydration   Essential hypertension   Hypothyroidism   Hyperlipidemia   Assessment and Plan:  * Acute respiratory failure with hypoxia (HCC) Secondary to COPD exacerbation in setting of suspected community-acquired pneumonia, right middle lobe Patient initially had hypoxia with oxygen saturation of 85% on room air with exertion.  Patient was placed on 2 L nasal cannula. 11/19: Day 3 of antibiotic, ceftriaxone  2 g IV: Azithromycin 500 mg IV daily initiated 11/20: Azithromycin 500 mg PO daily, day 2; ceftriaxone  2 g IV daily, day 4. Patient clinically improving.  Tobacco abuse Persistent As needed nicotine patch 21 mg, Nicorette gum 2 mg p.o.  History of CVA with  residual deficit Patient has been nonverbal due to CVA Nonverbal is patient's baseline Patient can nod yes or shake head for no  Seizure disorder (HCC) Currently stable Home Keppra 1000 mg nightly resumed  COPD with acute exacerbation (HCC) Secondary to continued tobacco use DuoNebs every 4 hours as needed for wheezing and shortness of breath  As needed cough medicine, Tessalon Perles ordered.  Lozenges for sore throat ordered  Essential hypertension Lisinopril  40 mg daily, amlodipine  5 mg daily were resumed Hydralazine 10 mg p.o. every 6 hours as needed for SBP greater 175, 2 days ordered  Dehydration Encourage p.o. intake  Hypernatremia Resolved  Hyperlipidemia Home atorvastatin  80 mg nightly  Hypothyroidism Home levothyroxine  75 mcg daily  DVT prophylaxis: Ambulate as tolerated Code Status: DNR Family Communication: Updated family at bedside Disposition Plan: Pending clinical course, anticipate discharge 02/26/2024 Level of care: Hospital at Home Med-Surg  Consultants:  Respiratory team  Procedures:  None  Antimicrobials: Ceftriaxone  2 g IV daily, azithromycin 500 mg daily  Subjective:  At bedside, patient appears to be awake and alert.  She is nonverbal at baseline.  She denies being in pain including chest, and abdomen.  She denies swelling in her legs.  She reports she misses smoking cigarettes.  She reports she cannot wait to be discharged from hospital at home service.  Objective: Vitals:   02/24/24 1100 02/24/24 2007 02/24/24 2040 02/25/24 0508  BP: (!) 148/73  (!) 170/87   Pulse: 80  74 (!) 55  Resp: 18  16 20   Temp: 97.8 F (36.6 C)  97.9 F (36.6 C)   TempSrc: Oral  Oral  SpO2: 93% 91% 93% 95%  Weight: 55.9 kg     Height:        Intake/Output Summary (Last 24 hours) at 02/25/2024 1402 Last data filed at 02/24/2024 1528 Gross per 24 hour  Intake 550 ml  Output --  Net 550 ml   Filed Weights   02/21/24 1526 02/23/24 0420 02/24/24  1100  Weight: 55.3 kg 55 kg 55.9 kg   Examination completed Victoria Huffman, medic:  General exam: Appears calm and comfortable  Respiratory system: Mild rhonchi in bilateral lower lobes. Respiratory effort normal. Cardiovascular system: S1 & S2 heard, RRR. No JVD, murmurs, rubs, gallops or clicks. No pedal edema. Gastrointestinal system: Abdomen is nondistended, soft and nontender. No organomegaly or masses felt. Normal bowel sounds heard. Central nervous system: Alert and oriented. No focal neurological deficits. Extremities: Symmetric 5 x 5 power. Skin: No rashes, lesions or ulcers Psychiatry: Judgement and insight appear normal. Mood & affect appropriate.   Data Reviewed: I have personally reviewed following labs and imaging studies  CBC: Recent Labs  Lab 02/21/24 1620 02/22/24 0619 02/23/24 0550 02/24/24 1212  WBC 12.0* 13.0* 22.2* 21.4*  NEUTROABS 9.1* 11.7*  --   --   HGB 16.0* 14.1 14.2 14.7  HCT 47.8* 42.3 42.7 44.5  MCV 91.9 91.6 92.6 93.1  PLT 412* 361 382 450*   Basic Metabolic Panel: Recent Labs  Lab 02/21/24 1620 02/22/24 0619 02/23/24 0550 02/24/24 1212  NA 146* 143 142 142  K 3.8 3.9 4.3 4.1  CL 105 105 107 100  CO2 26 26 26 24   GLUCOSE 116* 143* 148* 189*  BUN 32* 27* 25* 32*  CREATININE 0.91 0.70 0.81 0.88  CALCIUM  9.9 9.4 8.8* 9.5  MG  --  2.1  --   --   PHOS  --  2.4*  --   --    GFR: Estimated Creatinine Clearance: 45.7 mL/min (by C-G formula based on SCr of 0.88 mg/dL).  Liver Function Tests: Recent Labs  Lab 02/21/24 1620 02/22/24 0619 02/24/24 1212  AST 24 22 35  ALT 25 26 36  ALKPHOS 97 75 75  BILITOT 0.7 0.5 0.4  PROT 7.9 7.1 7.7  ALBUMIN 4.6 3.5 3.7   Sepsis Labs: Recent Labs  Lab 02/22/24 0951  PROCALCITON <0.10   Recent Results (from the past 240 hours)  Resp panel by RT-PCR (RSV, Flu A&B, Covid) Anterior Nasal Swab     Status: None   Collection Time: 02/21/24  3:29 PM   Specimen: Anterior Nasal Swab  Result Value  Ref Range Status   SARS Coronavirus 2 by RT PCR NEGATIVE NEGATIVE Final    Comment: (NOTE) SARS-CoV-2 target nucleic acids are NOT DETECTED.  The SARS-CoV-2 RNA is generally detectable in upper respiratory specimens during the acute phase of infection. The lowest concentration of SARS-CoV-2 viral copies this assay can detect is 138 copies/mL. A negative result does not preclude SARS-Cov-2 infection and should not be used as the sole basis for treatment or other patient management decisions. A negative result may occur with  improper specimen collection/handling, submission of specimen other than nasopharyngeal swab, presence of viral mutation(s) within the areas targeted by this assay, and inadequate number of viral copies(<138 copies/mL). A negative result must be combined with clinical observations, patient history, and epidemiological information. The expected result is Negative.  Fact Sheet for Patients:  bloggercourse.com  Fact Sheet for Healthcare Providers:  seriousbroker.it  This test is no t yet approved or cleared by the  [place]  FDA and  has been authorized for detection and/or diagnosis of SARS-CoV-2 by FDA under an Emergency Use Authorization (EUA). This EUA will remain  in effect (meaning this test can be used) for the duration of the COVID-19 declaration under Section 564(b)(1) of the Act, 21 U.S.C.section 360bbb-3(b)(1), unless the authorization is terminated  or revoked sooner.       Influenza A by PCR NEGATIVE NEGATIVE Final   Influenza B by PCR NEGATIVE NEGATIVE Final    Comment: (NOTE) The Xpert Xpress SARS-CoV-2/FLU/RSV plus assay is intended as an aid in the diagnosis of influenza from Nasopharyngeal swab specimens and should not be used as a sole basis for treatment. Nasal washings and aspirates are unacceptable for Xpert Xpress SARS-CoV-2/FLU/RSV testing.  Fact Sheet for  Patients: bloggercourse.com  Fact Sheet for Healthcare Providers: seriousbroker.it  This test is not yet approved or cleared by the [place]  FDA and has been authorized for detection and/or diagnosis of SARS-CoV-2 by FDA under an Emergency Use Authorization (EUA). This EUA will remain in effect (meaning this test can be used) for the duration of the COVID-19 declaration under Section 564(b)(1) of the Act, 21 U.S.C. section 360bbb-3(b)(1), unless the authorization is terminated or revoked.     Resp Syncytial Virus by PCR NEGATIVE NEGATIVE Final    Comment: (NOTE) Fact Sheet for Patients: bloggercourse.com  Fact Sheet for Healthcare Providers: seriousbroker.it  This test is not yet approved or cleared by the [place]  FDA and has been authorized for detection and/or diagnosis of SARS-CoV-2 by FDA under an Emergency Use Authorization (EUA). This EUA will remain in effect (meaning this test can be used) for the duration of the COVID-19 declaration under Section 564(b)(1) of the Act, 21 U.S.C. section 360bbb-3(b)(1), unless the authorization is terminated or revoked.  Performed at [facility], [address] [place], [place] [address]     Scheduled Meds:  amLODipine   5 mg Oral Daily   aspirin  EC  81 mg Oral QHS   atorvastatin   80 mg Oral QHS   azithromycin  500 mg Oral Daily   benzonatate  100 mg Oral BID   donepezil  10 mg Oral Daily   fluticasone  1 spray Each Nare QHS   guaiFENesin  600 mg Oral BID   levETIRAcetam  1,000 mg Oral QHS   levothyroxine   75 mcg Oral Q0600   lisinopril   40 mg Oral Daily   loratadine  10 mg Oral Daily   menthol  1 lozenge Oral QPM   modafinil  100 mg Oral Daily   nicotine  21 mg Transdermal Daily   pantoprazole  40 mg Oral Daily   predniSONE  60 mg Oral Daily   temazepam  15 mg Oral QHS   Continuous Infusions:   cefTRIAXone  (ROCEPHIN )  IV 2 g (02/25/24 1333)    LOS: 3 days   Time spent: 50 minutes  Dr. Sherre Location: [place], [place] [facility] If 7PM-7AM, please contact night-coverage 02/25/2024, 2:02 PM

## 2024-02-25 NOTE — Progress Notes (Signed)
 Patient alerting for Hypoxia x2 per [employer] data. SpO2 fluctuating intermittently.   Currently showing per classic view. Will continue to monitor need for contact call  16 rpm Nov 20, 12:34am  SPO2 94% Nov 20, 12:34am  Pulse 84 bpm Nov 20, 12:34am

## 2024-02-25 NOTE — Progress Notes (Signed)
 Arrived to find the PT sitting with her feet up in the recliner when personnel arrived. PT is Aox4 and acting to her baseline. PT's son and DIL are in the home.   PT's vitals were obtained and documented accordingly. PT was requesting nicotine  gum when we arrived and nicotine  patch was replaced on the right arm. PT's L/S were noted in the chart. All medications were administered as prescribed. PRN duo-neb was administered for L/S.   PT and family were contacted for outpatient palliative care. Virtual visit with Dr. Laurence was performed and stated that the PT had an expected d/c of tomorrow morning. IMM letter was signed by PT's care giver.   PT and family denied any other questions and were reminded that if they needed anything to call the virtual RN.

## 2024-02-25 NOTE — Plan of Care (Signed)
  Problem: Education: Goal: Knowledge of General Education information will improve Description: Including pain rating scale, medication(s)/side effects and non-pharmacologic comfort measures Outcome: Progressing   Problem: Health Behavior/Discharge Planning: Goal: Ability to manage health-related needs will improve Outcome: Progressing   Problem: Clinical Measurements: Goal: Ability to maintain clinical measurements within normal limits will improve Outcome: Progressing Goal: Will remain free from infection Outcome: Progressing Goal: Diagnostic test results will improve Outcome: Progressing Goal: Respiratory complications will improve Outcome: Progressing Goal: Cardiovascular complication will be avoided Outcome: Progressing   Problem: Activity: Goal: Risk for activity intolerance will decrease Outcome: Progressing   Problem: Nutrition: Goal: Adequate nutrition will be maintained Outcome: Progressing   Problem: Coping: Goal: Level of anxiety will decrease Outcome: Progressing   Problem: Elimination: Goal: Will not experience complications related to bowel motility Outcome: Progressing Goal: Will not experience complications related to urinary retention Outcome: Progressing   Problem: Pain Managment: Goal: General experience of comfort will improve and/or be controlled Outcome: Progressing   Problem: Safety: Goal: Ability to remain free from injury will improve Outcome: Progressing   Problem: Clinical Measurements: Goal: Ability to maintain a body temperature in the normal range will improve Outcome: Progressing   Problem: Respiratory: Goal: Ability to maintain adequate ventilation will improve Outcome: Progressing Goal: Ability to maintain a clear airway will improve Outcome: Progressing

## 2024-02-25 NOTE — Progress Notes (Signed)
 Phone call completed with patient's son. Son denies any patient needs at this time. Reviewed plan for medic to make a home visit tonight and assist with bedtime medications. Reminded son that RN is available all night via phone or tablet.

## 2024-02-25 NOTE — Plan of Care (Signed)

## 2024-02-26 ENCOUNTER — Other Ambulatory Visit (HOSPITAL_COMMUNITY): Payer: Self-pay

## 2024-02-26 MED ORDER — PREDNISONE 20 MG PO TABS
60.0000 mg | ORAL_TABLET | Freq: Every day | ORAL | 0 refills | Status: DC
  Filled 2024-02-26: qty 6, 2d supply, fill #0

## 2024-02-26 MED ORDER — PREDNISONE 20 MG PO TABS
60.0000 mg | ORAL_TABLET | Freq: Every day | ORAL | 0 refills | Status: AC
  Filled 2024-02-26 (×2): qty 6, 2d supply, fill #0

## 2024-02-26 NOTE — Progress Notes (Signed)
 Spoke with patient's husband via phone,confirmed pharmacy,med needs,upcoming paramedic visit and discharge.Pt does have walker that was delivered.

## 2024-02-26 NOTE — Progress Notes (Signed)
 Video rounding with patient,MD on camera, paramedic at home. Questions answered. Dc instructions reviewed with family and voice understanding.

## 2024-02-26 NOTE — Progress Notes (Signed)
 Verified am meds are at pt's home with husband via phone. Updated on timing of paramedic visit for DC.

## 2024-02-26 NOTE — Progress Notes (Signed)
 The pt was seen by the field team [facility] paramedic. Upon arrival at the house the pt was alert and oriented at her baseline. She made a hand gesture which her son explained she was asking for medication for her headache that had started approximately 30 minutes before arrival.   The pt was informed that some PRN medication could be given for her headache.  Vitals were taken and are as noted. The pt's lung sounds were ausculted and noted to be clear on the right side but had diminished breath sounds on the left. The pt was asked if she felt like needed another breathing treatment at thsi time and she denied same.    The virtual nurse was called to ask the pt her questions and perform her assessment.   Night time medication were given with the virtual nurse on call. The pt and family were asked if anything else could be done for them and they denied same. They were informed to call if anything were to change and they agreed to same. At this time the visit was complete.

## 2024-02-26 NOTE — Plan of Care (Signed)

## 2024-02-26 NOTE — Discharge Summary (Addendum)
 Physician Discharge Summary   Patient: Melinda Hinton MRN: [id number] DOB: 1950-07-29  Admit date:     02/21/2024  Discharge date: 02/26/24  Discharge Physician: Dr. Sherre   PCP: [place], [place]  E, PA-C   Recommendations at discharge:   Prednisone  60 mg daily has been prescribed for an additional 2 days to complete a 5-day course. I recommend you stop all tobacco smoke use.  You can call 1 800-Quit-Now and we nicotine  patches can be sent to your home. You have a diagnosis of COPD and at this time this cannot be cured however complete cessation of smoking will help immensely in terms of symptomatic improvement and morbidity and mortality improvement. Follow-up with your primary care doctor within 2 weeks of discharge.  Discharge Diagnoses: Principal Problem:   Acute respiratory failure with hypoxia (HCC) Active Problems:   COPD with acute exacerbation (HCC)   Seizure disorder (HCC)   History of CVA with residual deficit   Tobacco abuse   Hypernatremia   Dehydration   Essential hypertension   Hypothyroidism   Hyperlipidemia  Resolved Problems:   * No resolved hospital problems. [facility] Course:  Ms. Melinda Hinton is a 73 year old female with history of seizure disorder, hypothyroid, tobacco use, history of CVA with residual expressive aphasia, COPD, who presented on 02/21/2024, for chief concerns of cough.  Patient was admitted to hospitalist service for acute respiratory failure with hypoxia, COPD exacerbation with possible pneumonia.  Patient was admitted to hospital at home service on 02/23/2024 for the same to complete course of steroids, and empiric antibiotic.  11/19: Azithromycin  500 mg daily, initiated (day 1).  Ceftriaxone  2 g IV daily, day 3 of antibiotic.  DME for rolling walker placed due to gait instability.  11/20: Azithromycin  500 mg PO daily, day 2; ceftriaxone  2 g IV daily, day 4. Patient clinically improving. She desires increase in her nicotine  gum  if possible. Noted, that she is already on patch and gum. Anticipate discharge on 11/21.  11/21: Ceftriaxone  2 g IV daily, patient will have completed 5 today.  Azithromycin  500 mg p.o. daily, patient will complete 3 days of course.  Patient will be discharged from Hospital at Home service. Family noted that they will not allow her to smoke cigarettes until Thanksgiving day, at which time, 4 cigarettes will be allowed. Patient was initially upset at the news however, feels better knowing that she will get cigarettes again.   Assessment and Plan:  * Acute respiratory failure with hypoxia (HCC) Secondary to COPD exacerbation in setting of suspected community-acquired pneumonia, right middle lobe Patient initially had hypoxia with oxygen saturation of 85% on room air with exertion.  Patient was placed on 2 L nasal cannula. 11/19: Day 3 of antibiotic, ceftriaxone  2 g IV: Azithromycin  500 mg IV daily initiated 11/20: Azithromycin  500 mg PO daily, day 2; ceftriaxone  2 g IV daily, day 4. Patient clinically improving.  Tobacco abuse Persistent As needed nicotine  patch 21 mg, Nicorette  gum 2 mg p.o.  History of CVA with residual deficit Patient has been nonverbal due to CVA Nonverbal is patient's baseline Patient can nod yes or shake head for no  Seizure disorder (HCC) Currently stable Home Keppra  1000 mg nightly resumed  COPD with acute exacerbation (HCC) Secondary to continued tobacco use DuoNebs every 4 hours as needed for wheezing and shortness of breath  As needed cough medicine, Tessalon  Perles ordered.  Lozenges for sore throat ordered  Essential hypertension Lisinopril  40 mg daily, amlodipine  5 mg daily were  resumed Hydralazine  10 mg p.o. every 6 hours as needed for SBP greater 175, 2 days ordered  Dehydration Encourage p.o. intake  Hypernatremia Resolved  Hyperlipidemia Home atorvastatin  80 mg nightly  Hypothyroidism Home levothyroxine  75 mcg daily    Pain control -  [place]  Controlled Substance Reporting System database was reviewed. and patient was instructed, not to drive, operate heavy machinery, perform activities at heights, swimming or participation in water activities or provide baby-sitting services while on Pain, Sleep and Anxiety Medications; until their outpatient Physician has advised to do so again. Also recommended to not to take more than prescribed Pain, Sleep and Anxiety Medications.  Consultants: PT, OT Procedures performed: None Disposition: Relative's home Diet recommendation:  Cardiac diet DISCHARGE MEDICATION: Allergies as of 02/26/2024   No Known Allergies      Medication List     TAKE these medications    acetaminophen  500 MG tablet Commonly known as: TYLENOL  Take 1,000 mg by mouth every 6 (six) hours as needed for mild pain (pain score 1-3) or moderate pain (pain score 4-6).   amLODipine  5 MG tablet Commonly known as: NORVASC  Take 5 mg by mouth at bedtime.   Armodafinil  150 MG tablet Take 0.5-1 tablets (75-150 mg total) by mouth every morning for 2 days.   aspirin  EC 81 MG tablet Take 1 tablet (81 mg total) by mouth at bedtime. Swallow whole.   atorvastatin  80 MG tablet Commonly known as: LIPITOR  Take 1 tablet (80 mg total) by mouth at bedtime.   cetirizine  10 MG tablet Commonly known as: ZYRTEC  Take 10 mg by mouth daily after supper.   cyanocobalamin 1000 MCG tablet Commonly known as: VITAMIN B12 Take 1,000 mcg by mouth daily.   donepezil  10 MG tablet Commonly known as: ARICEPT  Take 10 mg by mouth daily.   fluticasone  50 MCG/ACT nasal spray Commonly known as: FLONASE  Place 1 spray into both nostrils at bedtime.   ibuprofen  200 MG tablet Commonly known as: ADVIL  Take 400 mg by mouth every 6 (six) hours as needed for mild pain (pain score 1-3) or moderate pain (pain score 4-6).   ipratropium 0.02 % nebulizer solution Commonly known as: ATROVENT  Inhale 0.5 mg into the lungs 3 (three) times  daily.   ipratropium 0.06 % nasal spray Commonly known as: ATROVENT  Place 2 sprays into both nostrils in the morning.   levETIRAcetam  500 MG 24 hr tablet Commonly known as: KEPPRA  XR Take 1,000 mg by mouth at bedtime.   levothyroxine  75 MCG tablet Commonly known as: SYNTHROID  Take 1 tablet (75 mcg total) by mouth daily before breakfast.   lisinopril  40 MG tablet Commonly known as: ZESTRIL  Take 40 mg by mouth daily.   NYQUIL PO Take 30 mLs by mouth as needed.   omeprazole 20 MG capsule Commonly known as: PRILOSEC Take 20 mg by mouth every morning.   predniSONE  20 MG tablet Commonly known as: DELTASONE  Take 3 tablets (60 mg total) by mouth daily for 2 days.   temazepam  15 MG capsule Commonly known as: RESTORIL  Take 1 capsule (15 mg total) by mouth at bedtime for 2 days.   VICKS FORMULA 44  COUGH RELIEF PO Take 30 mLs by mouth as needed.               Durable Medical Equipment  (From admission, onward)           Start     Ordered   02/26/24 0738  For home use only DME Walker  Once  Question Answer Comment  Patient needs a walker to treat with the following condition Gait instability   Patient needs a walker to treat with the following condition At risk for falling      02/26/24 0737   02/24/24 1455  For home use only DME Walker rolling  Once       Question Answer Comment  Walker: With 5 Inch Wheels   Patient needs a walker to treat with the following condition Falling episodes   Patient needs a walker to treat with the following condition Gait instability      02/24/24 1455   02/24/24 1150  For home use only DME Walker rolling  Once       Question Answer Comment  Walker: With 5 Inch Wheels   Patient needs a walker to treat with the following condition Gait instability      02/24/24 1150            Follow-up Information     [place], [place]  E, PA-C Follow up.   Specialty: Family Medicine Contact information: [address] [place] [address] [phone]                Discharge Exam: Fredricka Weights   02/23/24 0420 02/24/24 1100 02/25/24 1324  Weight: 55 kg 55.9 kg 56.2 kg   Physical exam completed with help of medic, Lauraine Faes. Physical Exam Vitals and nursing note reviewed.  Constitutional:      General: She is not in acute distress.    Appearance: Normal appearance. She is normal weight. She is not toxic-appearing.  HENT:     Head: Normocephalic and atraumatic.     Right Ear: External ear normal.     Left Ear: External ear normal.     Nose: Nose normal. No congestion.  Eyes:     Extraocular Movements: Extraocular movements intact.     Pupils: Pupils are equal, round, and reactive to light.  Cardiovascular:     Rate and Rhythm: Normal rate and regular rhythm.     Pulses: Normal pulses.     Heart sounds: Normal heart sounds.  Pulmonary:     Effort: Pulmonary effort is normal.     Comments: Right upper lobe, slight wheezing. Bilateral lower lobes has slightly diminished lung sounds and mild rhonchi of the left lower lobe on exhalation. Patient just finished eating. Abdominal:     General: Bowel sounds are normal.     Palpations: Abdomen is soft.  Musculoskeletal:        General: No swelling. Normal range of motion.     Cervical back: Normal range of motion.  Skin:    General: Skin is warm and dry.     Capillary Refill: Capillary refill takes less than 2 seconds.  Neurological:     General: No focal deficit present.     Mental Status: She is alert. Mental status is at baseline.  Psychiatric:        Mood and Affect: Mood normal.        Behavior: Behavior normal.   Condition at discharge: good  The results of significant diagnostics from this hospitalization (including imaging, microbiology, ancillary and laboratory) are listed below for reference.   Imaging Studies: CT Chest Wo Contrast Result Date: 02/21/2024 EXAM: CT CHEST WITHOUT CONTRAST 02/21/2024 04:46:56 PM  TECHNIQUE: CT of the chest was performed without the administration of intravenous contrast. Multiplanar reformatted images are provided for review. Automated exposure control, iterative reconstruction, and/or weight based  adjustment of the mA/kV was utilized to reduce the radiation dose to as low as reasonably achievable. COMPARISON: 09/09/2023 CLINICAL HISTORY: Pneumonia, complication suspected, xray done; COPD, cough x several weeks, not improving, eval PNA (question on pna on cxr) FINDINGS: MEDIASTINUM: Heart and pericardium are unremarkable. The central airways are clear. Atheromatous calcifications of the aorta and its branches. LYMPH NODES: No mediastinal, hilar or axillary lymphadenopathy. LUNGS AND PLEURA: Diffuse emphysema and bullous disease. A 6 mm pleural based right upper lobe nodule measures slightly smaller. No focal consolidation or pulmonary edema. No pleural effusion or pneumothorax. SOFT TISSUES/BONES: No acute abnormality of the bones or soft tissues. UPPER ABDOMEN: Limited images of the upper abdomen demonstrates no acute abnormality. IMPRESSION: 1. Diffuse pulmonary emphysema and bullous disease; for patients 75 years old, consider evaluation for a low-dose CT lung cancer screening program 2. Single 6 mm solid pleural-based right upper lobe pulmonary nodule, slightly smaller; per Fleischner Society Guidelines, recommend non-contrast chest CT at 612 months, then consider an additional non-contrast chest CT at 1824 months (adjust based on patient malignancy risk) Electronically signed by: Fonda Field MD 02/21/2024 04:59 PM EST RP Workstation: [id number]   DG Chest 2 View Result Date: 02/21/2024 CLINICAL DATA:  Cough and fatigue with decreased appetite 2 weeks. EXAM: CHEST - 2 VIEW COMPARISON:  09/15/2023, 07/29/2021 FINDINGS: Lungs are adequately inflated with emphysematous disease over the upper lungs. Minimal density over the region of the right middle lobe/lingula on the lateral  image likely atelectasis. Early infection is possible. No effusion. Cardiomediastinal silhouette and remainder of the exam is unchanged. IMPRESSION: 1. Minimal density over the region of the right middle lobe/lingula on the lateral image likely atelectasis. Early infection is possible. 2. Emphysematous disease. Electronically Signed   By: Toribio Agreste M.D.   On: 02/21/2024 15:58   Microbiology: Results for orders placed or performed during the hospital encounter of 02/21/24  Resp panel by RT-PCR (RSV, Flu A&B, Covid) Anterior Nasal Swab     Status: None   Collection Time: 02/21/24  3:29 PM   Specimen: Anterior Nasal Swab  Result Value Ref Range Status   SARS Coronavirus 2 by RT PCR NEGATIVE NEGATIVE Final    Comment: (NOTE) SARS-CoV-2 target nucleic acids are NOT DETECTED.  The SARS-CoV-2 RNA is generally detectable in upper respiratory specimens during the acute phase of infection. The lowest concentration of SARS-CoV-2 viral copies this assay can detect is 138 copies/mL. A negative result does not preclude SARS-Cov-2 infection and should not be used as the sole basis for treatment or other patient management decisions. A negative result may occur with  improper specimen collection/handling, submission of specimen other than nasopharyngeal swab, presence of viral mutation(s) within the areas targeted by this assay, and inadequate number of viral copies(<138 copies/mL). A negative result must be combined with clinical observations, patient history, and epidemiological information. The expected result is Negative.  Fact Sheet for Patients:  bloggercourse.com  Fact Sheet for Healthcare Providers:  seriousbroker.it  This test is no t yet approved or cleared by the [place]  FDA and  has been authorized for detection and/or diagnosis of SARS-CoV-2 by FDA under an Emergency Use Authorization (EUA). This EUA will remain  in effect  (meaning this test can be used) for the duration of the COVID-19 declaration under Section 564(b)(1) of the Act, 21 U.S.C.section 360bbb-3(b)(1), unless the authorization is terminated  or revoked sooner.       Influenza A by PCR NEGATIVE NEGATIVE Final   Influenza B  by PCR NEGATIVE NEGATIVE Final    Comment: (NOTE) The Xpert Xpress SARS-CoV-2/FLU/RSV plus assay is intended as an aid in the diagnosis of influenza from Nasopharyngeal swab specimens and should not be used as a sole basis for treatment. Nasal washings and aspirates are unacceptable for Xpert Xpress SARS-CoV-2/FLU/RSV testing.  Fact Sheet for Patients: bloggercourse.com  Fact Sheet for Healthcare Providers: seriousbroker.it  This test is not yet approved or cleared by the [place]  FDA and has been authorized for detection and/or diagnosis of SARS-CoV-2 by FDA under an Emergency Use Authorization (EUA). This EUA will remain in effect (meaning this test can be used) for the duration of the COVID-19 declaration under Section 564(b)(1) of the Act, 21 U.S.C. section 360bbb-3(b)(1), unless the authorization is terminated or revoked.     Resp Syncytial Virus by PCR NEGATIVE NEGATIVE Final    Comment: (NOTE) Fact Sheet for Patients: bloggercourse.com  Fact Sheet for Healthcare Providers: seriousbroker.it  This test is not yet approved or cleared by the [place]  FDA and has been authorized for detection and/or diagnosis of SARS-CoV-2 by FDA under an Emergency Use Authorization (EUA). This EUA will remain in effect (meaning this test can be used) for the duration of the COVID-19 declaration under Section 564(b)(1) of the Act, 21 U.S.C. section 360bbb-3(b)(1), unless the authorization is terminated or revoked.  Performed at [facility], [address] [place], [place] [address]     Labs: CBC: Recent Labs  Lab 02/21/24 1620 02/22/24 0619 02/23/24 0550 02/24/24 1212  WBC 12.0* 13.0* 22.2* 21.4*  NEUTROABS 9.1* 11.7*  --   --   HGB 16.0* 14.1 14.2 14.7  HCT 47.8* 42.3 42.7 44.5  MCV 91.9 91.6 92.6 93.1  PLT 412* 361 382 450*   Basic Metabolic Panel: Recent Labs  Lab 02/21/24 1620 02/22/24 0619 02/23/24 0550 02/24/24 1212  NA 146* 143 142 142  K 3.8 3.9 4.3 4.1  CL 105 105 107 100  CO2 26 26 26 24   GLUCOSE 116* 143* 148* 189*  BUN 32* 27* 25* 32*  CREATININE 0.91 0.70 0.81 0.88  CALCIUM  9.9 9.4 8.8* 9.5  MG  --  2.1  --   --   PHOS  --  2.4*  --   --    Liver Function Tests: Recent Labs  Lab 02/21/24 1620 02/22/24 0619 02/24/24 1212  AST 24 22 35  ALT 25 26 36  ALKPHOS 97 75 75  BILITOT 0.7 0.5 0.4  PROT 7.9 7.1 7.7  ALBUMIN 4.6 3.5 3.7   Discharge time spent: greater than 30 minutes.  Signed: Dr. Sherre Location: [place], [place] [facility] 02/26/2024

## 2024-03-02 ENCOUNTER — Ambulatory Visit

## 2024-03-02 VITALS — BP 105/64 | HR 54 | Ht 63.0 in | Wt 125.0 lb

## 2024-03-02 DIAGNOSIS — Z8673 Personal history of transient ischemic attack (TIA), and cerebral infarction without residual deficits: Secondary | ICD-10-CM

## 2024-03-02 DIAGNOSIS — J449 Chronic obstructive pulmonary disease, unspecified: Secondary | ICD-10-CM

## 2024-03-02 DIAGNOSIS — F17210 Nicotine dependence, cigarettes, uncomplicated: Secondary | ICD-10-CM

## 2024-03-02 DIAGNOSIS — Z720 Tobacco use: Secondary | ICD-10-CM

## 2024-03-02 DIAGNOSIS — R918 Other nonspecific abnormal finding of lung field: Secondary | ICD-10-CM | POA: Diagnosis not present

## 2024-03-02 DIAGNOSIS — Z09 Encounter for follow-up examination after completed treatment for conditions other than malignant neoplasm: Secondary | ICD-10-CM

## 2024-03-02 NOTE — Progress Notes (Signed)
 New Patient Pulmonology Office Visit   Subjective:  Patient ID: Melinda Hinton, female    DOB: 03/02/1951  MRN: [id number]  Referred by: Fulbright, [place]  E, *  CC:  Chief Complaint  Patient presents with   Consult   COPD    Patient breathing is back to normal and coughing is better   Hospitalization Follow-up    HPI Melinda Hinton is a 73 y.o. female who is here for follow-up after hospital admissions for COPD  Discussed the use of AI scribe software for clinical note transcription with the patient, who gave verbal consent to proceed.  History of Present Illness Melinda Hinton is a 73 year old female with COPD who presents for follow-up after a recent hospitalization for respiratory issues.  She has a long-standing history of COPD and was recently hospitalized for respiratory issues. Since her discharge, her cough has resolved, and her breathing has returned to her baseline. She has been using a nebulizer, which was recently introduced, but she does not use it frequently. She feels better after using it. Prior to this episode, she experienced frequent wheezing but not a persistent cough. She was unable to use regularly inhalers  She has a history of smoking but quit during her recent hospital stay. However, she plans to resume smoking at a reduced rate of four cigarettes per day.  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 This has affected her ability to speak, and she communicates through gestures. She is currently on Keppra  for seizures. She is also unable to follow instructions for PFT  A CT scan from June 2025 showed a small spot on her lung.  Her energy levels have decreased over the past year, impacting her ability to perform activities such as gardening and house cleaning.  She lives with her family, who all experienced a similar illness with cough and other symptoms around the time of her  hospitalization.     ROS Review of symptoms negative except mentioned above   Allergies: Patient has no known allergies.  Current Outpatient Medications:    acetaminophen  (TYLENOL ) 500 MG tablet, Take 1,000 mg by mouth every 6 (six) hours as needed for mild pain (pain score 1-3) or moderate pain (pain score 4-6)., Disp: , Rfl:    amLODipine  (NORVASC ) 5 MG tablet, Take 5 mg by mouth at bedtime., Disp: , Rfl:    Armodafinil  150 MG tablet, Take 0.5-1 tablets (75-150 mg total) by mouth every morning for 2 days., Disp: 2 tablet, Rfl: 0   aspirin  EC 81 MG EC tablet, Take 1 tablet (81 mg total) by mouth at bedtime. Swallow whole., Disp: 30 tablet, Rfl: 11   atorvastatin  (LIPITOR ) 80 MG tablet, Take 1 tablet (80 mg total) by mouth at bedtime., Disp: 30 tablet, Rfl: 0   cetirizine  (ZYRTEC ) 10 MG tablet, Take 10 mg by mouth daily after supper., Disp: , Rfl:    cyanocobalamin (VITAMIN B12) 1000 MCG tablet, Take 1,000 mcg by mouth daily., Disp: , Rfl:    Dextromethorphan HBr (VICKS FORMULA 44  COUGH RELIEF PO), Take 30 mLs by mouth as needed., Disp: , Rfl:    donepezil  (ARICEPT ) 10 MG tablet, Take 10 mg by mouth daily., Disp: , Rfl:    fluticasone  (FLONASE ) 50 MCG/ACT nasal spray, Place 1 spray into both nostrils at bedtime., Disp: , Rfl:    ibuprofen  (ADVIL ) 200 MG tablet, Take 400 mg by mouth every 6 (six) hours as  needed for mild pain (pain score 1-3) or moderate pain (pain score 4-6)., Disp: , Rfl:    ipratropium (ATROVENT ) 0.02 % nebulizer solution, Inhale 0.5 mg into the lungs 3 (three) times daily., Disp: , Rfl:    levETIRAcetam  (KEPPRA  XR) 500 MG 24 hr tablet, Take 1,000 mg by mouth at bedtime., Disp: , Rfl:    levothyroxine  (SYNTHROID ) 75 MCG tablet, Take 1 tablet (75 mcg total) by mouth daily before breakfast., Disp: 30 tablet, Rfl: 0   lisinopril  (ZESTRIL ) 40 MG tablet, Take 40 mg by mouth daily., Disp: , Rfl:    omeprazole (PRILOSEC) 20 MG capsule, Take 20 mg by mouth every morning., Disp: ,  Rfl:    Pseudoeph-Doxylamine-DM-APAP (NYQUIL PO), Take 30 mLs by mouth as needed., Disp: , Rfl:    temazepam  (RESTORIL ) 15 MG capsule, Take 1 capsule (15 mg total) by mouth at bedtime for 2 days., Disp: 2 capsule, Rfl: 0   ipratropium (ATROVENT ) 0.06 % nasal spray, Place 2 sprays into both nostrils in the morning. (Patient not taking: Reported on 03/02/2024), Disp: , Rfl:  Past Medical History:  Diagnosis Date   Aneurysm    Expressive aphasia    Heart attack (HCC)    Stroke [facility])    Past Surgical History:  Procedure Laterality Date   ABDOMINAL HYSTERECTOMY Bilateral    ANEURYSM COILING     History reviewed. No pertinent family history. Social History   Socioeconomic History   Marital status: Widowed    Spouse name: Not on file   Number of children: Not on file   Years of education: Not on file   Highest education level: Not on file  Occupational History   Not on file  Tobacco Use   Smoking status: Every Day    Current packs/day: 0.50    Types: Cigarettes   Smokeless tobacco: Never   Tobacco comments:    Started smoking at age 3.  7 cigarettes a day.  Substance and Sexual Activity   Alcohol use: No   Drug use: No   Sexual activity: Not on file  Other Topics Concern   Not on file  Social History Narrative   Not on file   Social Drivers of Health   Financial Resource Strain: Not on file  Food Insecurity: No Food Insecurity (02/22/2024)   Hunger Vital Sign    Worried About Running Out of Food in the Last Year: Never true    Ran Out of Food in the Last Year: Never true  Transportation Needs: No Transportation Needs (02/22/2024)   PRAPARE - Administrator, Civil Service (Medical): No    Lack of Transportation (Non-Medical): No  Physical Activity: Not on file  Stress: Not on file  Social Connections: Moderately Isolated (02/22/2024)   Social Connection and Isolation Panel    Frequency of Communication with Friends and Family: More than three times a week     Frequency of Social Gatherings with Friends and Family: Never    Attends Religious Services: Never    Database Administrator or Organizations: No    Attends Banker Meetings: Never    Marital Status: Married  Catering Manager Violence: Not At Risk (02/22/2024)   Humiliation, Afraid, Rape, and Kick questionnaire    Fear of Current or Ex-Partner: No    Emotionally Abused: No    Physically Abused: No    Sexually Abused: No        Objective:  BP 105/64   Pulse ROLLEN)  54   Ht 5' 3 (1.6 m)   Wt 125 lb (56.7 kg)   LMP 04/24/2012   SpO2 95%   BMI 22.14 kg/m    Physical Exam Constitutional:      General: She is not in acute distress.    Appearance: Normal appearance.  HENT:     Mouth/Throat:     Mouth: Mucous membranes are moist.  Cardiovascular:     Rate and Rhythm: Normal rate.  Pulmonary:     Effort: No respiratory distress.     Breath sounds: No wheezing or rales.  Musculoskeletal:     Right lower leg: No edema.     Left lower leg: No edema.  Skin:    General: Skin is warm.  Neurological:     Mental Status: She is alert and oriented to person, place, and time.  Psychiatric:        Mood and Affect: Mood normal.     Diagnostic Review:    Pft     No data to display              Results RADIOLOGY Chest CT: Stable or smaller nodule on right lung below pleura, emphysema in both lungs, stable nodules on left lung below pleura (02/2024)        Assessment & Plan:   Assessment & Plan Chronic obstructive pulmonary disease, unspecified COPD type (HCC) Discussed the symptoms, etiology, pathophysiology, diagnostic test, treatment, flare ups,  prognosis of copd CURRENTLY symptoms stable on Atrovent  nebulizer. Patient unable to follow instructions for PFT or use other inhalers It appears to me that her COPD exacerbation recently was related to the illness that was going around in her family at that time     Tobacco abuse Patient wants to  resume cigarette smoke as per her son, who does not want to limit her wishes.  They are seeking palliative consultation Advised them to consider  and avoiding smoking to the lungs    Lung nodules Stable and recent CT chest November/2025, compared to prior CT chest Will order for follow-up CT chest at next visit    Hospital discharge follow-up Patient is back to baseline and is doing well    History of CVA (cerebrovascular accident) Pt is non verbal Her son and daughter in law live in the same house as her They are seeking palliative consultation.I encouraged them to keep up the appointment      Thank you for the opportunity to take part in the care of Nixon Sparr   Return in about 6 months (around 08/30/2024).   Arturo Freundlich Pleas, MD [facility] Office: [phone]

## 2024-03-02 NOTE — Patient Instructions (Signed)
  VISIT SUMMARY: You had a follow-up visit to discuss your COPD and recent hospitalization. Your cough has resolved, and your breathing is back to normal. We also talked about your smoking habits and the importance of limiting smoke exposure.  YOUR PLAN: CHRONIC OBSTRUCTIVE PULMONARY DISEASE (COPD) WITH EMPHYSEMA: Your COPD is well-managed, but you still experience wheezing. You have been using a nebulizer, which helps you feel better. -Continue using the ipratropium nebulizer three to four times daily. -We may consider a long-acting bronchodilator nebulizer if you have more flare-ups in future -Try to limit your exposure to smoke to prevent worsening of your COPD.  STABLE PULMONARY NODULES: You have small spots on your lungs that have not changed in size since June 2025, which is a good sign. -at your next visit, We will schedule a follow-up CT scan  to monitor the nodules.  TOBACCO USE: You recently quit smoking during your hospital stay but plan to start again at a reduced rate. -It is very important to limit smoking to reduce the risk of worsening your COPD and causing more lung damage.   Contains text generated by [employer].

## 2024-03-02 NOTE — Assessment & Plan Note (Addendum)
 Patient wants to resume cigarette smoke as per her son, who does not want to limit her wishes.  They are seeking palliative consultation Advised them to consider  and avoiding smoking to the lungs
